# Patient Record
Sex: Female | Born: 1970 | Race: White | Hispanic: No | Marital: Married | Smoking: Never smoker
Health system: Southern US, Community
[De-identification: ages and names within clinical notes are randomized; demographics above are authoritative.]

## PROBLEM LIST (undated history)

## (undated) DIAGNOSIS — N809 Endometriosis, unspecified: Secondary | ICD-10-CM

## (undated) DIAGNOSIS — M797 Fibromyalgia: Secondary | ICD-10-CM

## (undated) DIAGNOSIS — Z8669 Personal history of other diseases of the nervous system and sense organs: Secondary | ICD-10-CM

## (undated) DIAGNOSIS — F329 Major depressive disorder, single episode, unspecified: Secondary | ICD-10-CM

## (undated) DIAGNOSIS — D219 Benign neoplasm of connective and other soft tissue, unspecified: Secondary | ICD-10-CM

## (undated) DIAGNOSIS — Z87440 Personal history of urinary (tract) infections: Secondary | ICD-10-CM

## (undated) DIAGNOSIS — U071 COVID-19: Secondary | ICD-10-CM

## (undated) DIAGNOSIS — E041 Nontoxic single thyroid nodule: Secondary | ICD-10-CM

## (undated) DIAGNOSIS — F32A Depression, unspecified: Secondary | ICD-10-CM

## (undated) DIAGNOSIS — F419 Anxiety disorder, unspecified: Secondary | ICD-10-CM

## (undated) DIAGNOSIS — Z8619 Personal history of other infectious and parasitic diseases: Secondary | ICD-10-CM

## (undated) HISTORY — DX: Benign neoplasm of connective and other soft tissue, unspecified: D21.9

## (undated) HISTORY — DX: Nontoxic single thyroid nodule: E04.1

## (undated) HISTORY — DX: Fibromyalgia: M79.7

## (undated) HISTORY — DX: Personal history of other diseases of the nervous system and sense organs: Z86.69

## (undated) HISTORY — DX: Anxiety disorder, unspecified: F41.9

## (undated) HISTORY — DX: Personal history of urinary (tract) infections: Z87.440

## (undated) HISTORY — DX: Personal history of other infectious and parasitic diseases: Z86.19

## (undated) HISTORY — DX: Depression, unspecified: F32.A

## (undated) HISTORY — DX: Endometriosis, unspecified: N80.9

## (undated) HISTORY — DX: Major depressive disorder, single episode, unspecified: F32.9

---
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
## 1997-09-01 ENCOUNTER — Other Ambulatory Visit: Admission: RE | Admit: 1997-09-01 | Discharge: 1997-09-01 | Payer: Self-pay | Admitting: Obstetrics and Gynecology

## 1997-10-06 ENCOUNTER — Encounter: Admission: RE | Admit: 1997-10-06 | Discharge: 1997-10-06 | Payer: Self-pay | Admitting: *Deleted

## 1998-09-24 ENCOUNTER — Other Ambulatory Visit: Admission: RE | Admit: 1998-09-24 | Discharge: 1998-09-24 | Payer: Self-pay | Admitting: Obstetrics and Gynecology

## 1998-12-17 ENCOUNTER — Ambulatory Visit (HOSPITAL_COMMUNITY): Admission: RE | Admit: 1998-12-17 | Discharge: 1998-12-17 | Payer: Self-pay | Admitting: Family Medicine

## 1998-12-17 ENCOUNTER — Encounter: Payer: Self-pay | Admitting: Family Medicine

## 1999-03-03 ENCOUNTER — Ambulatory Visit (HOSPITAL_COMMUNITY): Admission: RE | Admit: 1999-03-03 | Discharge: 1999-03-03 | Payer: Self-pay | Admitting: Obstetrics and Gynecology

## 1999-09-24 ENCOUNTER — Other Ambulatory Visit: Admission: RE | Admit: 1999-09-24 | Discharge: 1999-09-24 | Payer: Self-pay | Admitting: Obstetrics & Gynecology

## 2001-01-04 ENCOUNTER — Other Ambulatory Visit: Admission: RE | Admit: 2001-01-04 | Discharge: 2001-01-04 | Payer: Self-pay | Admitting: Obstetrics and Gynecology

## 2001-01-24 ENCOUNTER — Ambulatory Visit (HOSPITAL_COMMUNITY): Admission: RE | Admit: 2001-01-24 | Discharge: 2001-01-24 | Payer: Self-pay | Admitting: Obstetrics and Gynecology

## 2001-01-24 ENCOUNTER — Encounter: Payer: Self-pay | Admitting: Obstetrics and Gynecology

## 2001-08-16 ENCOUNTER — Encounter: Payer: Self-pay | Admitting: General Practice

## 2001-08-16 ENCOUNTER — Encounter: Admission: RE | Admit: 2001-08-16 | Discharge: 2001-08-16 | Payer: Self-pay | Admitting: General Practice

## 2002-02-18 ENCOUNTER — Other Ambulatory Visit: Admission: RE | Admit: 2002-02-18 | Discharge: 2002-02-18 | Payer: Self-pay | Admitting: Obstetrics and Gynecology

## 2002-06-17 ENCOUNTER — Ambulatory Visit (HOSPITAL_COMMUNITY): Admission: RE | Admit: 2002-06-17 | Discharge: 2002-06-17 | Payer: Self-pay | Admitting: *Deleted

## 2002-06-17 ENCOUNTER — Encounter: Payer: Self-pay | Admitting: *Deleted

## 2004-06-10 ENCOUNTER — Encounter: Admission: RE | Admit: 2004-06-10 | Discharge: 2004-06-10 | Payer: Self-pay | Admitting: Obstetrics and Gynecology

## 2005-04-11 HISTORY — PX: BREAST BIOPSY: SHX20

## 2007-05-18 ENCOUNTER — Encounter: Admission: RE | Admit: 2007-05-18 | Discharge: 2007-05-18 | Payer: Self-pay | Admitting: *Deleted

## 2009-04-11 HISTORY — PX: ABDOMINAL HYSTERECTOMY: SHX81

## 2009-04-11 LAB — HM PAP SMEAR: HM Pap smear: NORMAL

## 2009-06-18 ENCOUNTER — Encounter: Admission: RE | Admit: 2009-06-18 | Discharge: 2009-06-18 | Payer: Self-pay | Admitting: Certified Nurse Midwife

## 2010-03-30 ENCOUNTER — Encounter (INDEPENDENT_AMBULATORY_CARE_PROVIDER_SITE_OTHER): Payer: Self-pay | Admitting: Obstetrics & Gynecology

## 2010-03-30 ENCOUNTER — Ambulatory Visit (HOSPITAL_COMMUNITY)
Admission: RE | Admit: 2010-03-30 | Discharge: 2010-03-30 | Payer: Self-pay | Source: Home / Self Care | Attending: Obstetrics & Gynecology | Admitting: Obstetrics & Gynecology

## 2010-05-07 NOTE — Op Note (Addendum)
[name], [name]                  ACCOUNT NO.:  [id number]  MEDICAL RECORD NO.:  [id number]          PATIENT TYPE:  OIB  LOCATION:  9302                          FACILITY:  [facility]  PHYSICIAN:  Genia Del, M.D.DATE OF BIRTH:  05/12/1970  DATE OF PROCEDURE:  03/30/2010 DATE OF DISCHARGE:  03/30/2010                              OPERATIVE REPORT   PREOPERATIVE DIAGNOSIS:  Large uterine myomas with pelvic pain.  POSTOPERATIVE DIAGNOSIS:  Large uterine myomas with pelvic pain.  PROCEDURE:  Total laparoscopic hysterectomy assisted with da Vinci robot and morcellation of the uterus.  SURGEON:  Genia Del, MD  ASSISTANT:  Lendon Colonel, MD  ANESTHESIOLOGIST:  Jenelle Mages. Fortune, MD  PROCEDURE:  Under general anesthesia with endotracheal intubation, the patient is in lithotomy position, she was prepped with SurgiPrep on the abdomen and Betadine on the vulvar and vaginal areas.  The Foley was put in place in the bladder and the patient was draped as usual.  The vaginal exam reveals a large irregular uterus about 14-15 cm in diameter.  The uterus was mobile, no adnexal masses were felt.  We inserted the weighted speculum in the vagina.  The anterior lip of the cervix was grasped with a tenaculum.  We dilated the cervix with Hegar dilators up to #27 without difficulty.  Hysterometry was at 9 cm.  We used a #8 KOH ring with the medium-size RUMI, this was put in place without difficulty.  The other instruments were removed.  We went to the abdomen and measured 10 cm above the fundus of the uterus.  We infiltrated Marcaine 0.25 plain at that level where about 2 cm above the umbilicus.  We made a 1.5-cm incision with the scalpel.  We opened the aponeurosis with Mayo scissors under direct vision.  We opened the parietal peritoneum bluntly with a finger.  A pursestring stitch of Vicryl 0 was applied on the aponeurosis.  We then inserted the Hasson under direct vision.  We  created pneumoperitoneum with CO2.  We inspected the abdominopelvic cavities.  No pathology was present in the abdomen.  The anterior wall was free of adhesions.  The uterus was large and irregular with multiple myomas.  We then removed the camera.  We marked the skin for a semicircular configuration of the ports.  We infiltrated Marcaine 0.25 plain at each site and made small incisions with a scalpel.  We entered 2 robotic ports on the left side superiorly and inferiorly and 1 robotic port superiorly on the right and the assistant port on the lower right, all under direct vision.  We then docked the robot on the left side without difficulty and put the instruments and the EndoShear scissor on the first arm, the PK on the second arm, and the Cobra in the third arm.  We then went to the console.  We inspected the pelvic cavity further.  Both ovaries were normal in size and appearance.  Both tubes were normal in size and appearance.  The uterus has a large fundal myoma at least 9 cm in diameter.  A moderate-sized myoma was present anteriorly  measuring about 4-5 cm.  We started on the left side.  The ovaries will be preserved, so we cauterized and sectioned the left round ligament.  We cauterized and sectioned the left uteroovarian ligament and the left tube.  We then descended along the left side of the uterus.  We opened the visceral peritoneum on the left side and anteriorly towards the midline.  We then moved to the right side and did exactly the same.  We cauterized and sectioned the right round ligament.  We cauterized and sectioned the right uteroovarian ligament and the right tube and we then descended along the right side of the uterus.  We stopped just before the uterine artery.  We tried to open the visceral peritoneum anteriorly to descent the bladder, but finding the right plane and the right level is difficult.  We tried to adjust the KOH ring and position it more anteriorly.   We finally succeeded in having a good vision.  We completed the opening of the anterior visceral peritoneum and were able to descent the bladder past the KOH ring.  We cauterized and sectioned the left uterine artery.  We then cauterized and sectioned the right uterine artery that process also takes more time because of difficulties positioning the KOH ring ideally.  Finally, we have both uterine arteries well cauterized and the bladder well descended.  The uterus was blanching.  We were able to open the vaginal vault anteriorly along the KOH ring.  We opened with the tips of the EndoShear scissors and we went all the way to the left lateral side and towards the right lateral side. We then have to reposition the KOH ring to open the vaginal vault posteriorly.  We finally achieved a good position and were able to continue to open the vagina along the KOH ring.  We finished on the right lateral side.  The uterus was completely detached with the cervix and was put in the right gutter.  We verified hemostasis on all pedicles.  Hemostasis was adequate at all levels.  We had inflated the occluder vaginally and the KOH ring had been removed with the RUMI.  We then switched instruments.  We removed the EndoShear scissors and the PK as well as the Cobra.  We replaced those instruments by cutting needle driver in the first arm the regular needle driver in the second arm, and we put back the PK in the third arm.  We used Vicryl 0 six inches long and closed the vaginal vault with figure-of-eights.  We used one in the middle first and used it to pull the vaginal os for better vision.  We then put a figure-of-eight at the right angle, then the left angle, and finished closing the middle of the vaginal vault.  We took good bites each time including the vaginal mucosa.  The vaginal vault was well closed and hemostasis was adequate.  We confirmed that with irrigation and suction.  We then removed all  robotic instruments.  We undocked the robot and go by laparoscopy for morcellation.  We used the [employer] which replaced the assistance port.  We morcellated the uterus with the cervix and fibroids easily with that morcellator.  The weight of the specimen was 875 g, it was sent to Pathology.  We then irrigated and suctioned the abdominopelvic cavity.  We confirmed good hemostasis at all levels.  No remaining specimen was present in the abdominopelvic cavities.  We therefore removed all instruments.  We evacuated  the CO2.  We closed the supraumbilical incision by attaching the pursestring stitch on the aponeurosis.  We then used a Vicryl 0 at the supraumbilical incision to close the adipose tissue.  We did the same at the assistant port.  We then used Vicryl 4-0 on all incisions to close the skin in a subcuticular stitch and applied Dermabond on all incisions.  Hemostasis was adequate at all levels.  The occluder was removed from the vagina.  Hemostasis was adequate at that level as well. Note that the patient received Flagyl and gentamicin IV before induction.  The estimated blood loss was 250 mL.  The count of instruments and sponges was complete x2.  No complications occurred and the patient was brought to recovery room in good stable status.     Genia Del, M.D.     ML/MEDQ  D:  03/30/2010  T:  03/31/2010  Job:  [id number]  Electronically Signed by Genia Del M.D. on 05/07/2010 09:15:52 AM

## 2010-06-21 LAB — CBC
HCT: 43.3 % (ref 36.0–46.0)
Hemoglobin: 15 g/dL (ref 12.0–15.0)
MCH: 31.3 pg (ref 26.0–34.0)
MCHC: 34.6 g/dL (ref 30.0–36.0)
MCV: 90.4 fL (ref 78.0–100.0)
Platelets: 238 10*3/uL (ref 150–400)
RBC: 4.79 MIL/uL (ref 3.87–5.11)
RDW: 13.1 % (ref 11.5–15.5)
WBC: 7.7 10*3/uL (ref 4.0–10.5)

## 2010-06-21 LAB — SURGICAL PCR SCREEN
MRSA, PCR: NEGATIVE
Staphylococcus aureus: NEGATIVE

## 2010-06-21 LAB — PREGNANCY, URINE: Preg Test, Ur: NEGATIVE

## 2011-12-20 ENCOUNTER — Ambulatory Visit: Payer: Self-pay | Admitting: Internal Medicine

## 2011-12-21 ENCOUNTER — Other Ambulatory Visit: Payer: Self-pay | Admitting: Family Medicine

## 2011-12-21 DIAGNOSIS — Z1231 Encounter for screening mammogram for malignant neoplasm of breast: Secondary | ICD-10-CM

## 2012-01-06 ENCOUNTER — Ambulatory Visit
Admission: RE | Admit: 2012-01-06 | Discharge: 2012-01-06 | Disposition: A | Discharge status: Home / Self Care | Payer: 59 | Source: Ambulatory Visit | Attending: Family Medicine | Admitting: Family Medicine

## 2012-01-06 DIAGNOSIS — Z1231 Encounter for screening mammogram for malignant neoplasm of breast: Secondary | ICD-10-CM

## 2012-06-09 LAB — HM DIABETES EYE EXAM

## 2013-01-16 ENCOUNTER — Other Ambulatory Visit: Payer: Self-pay

## 2013-01-16 DIAGNOSIS — Z1231 Encounter for screening mammogram for malignant neoplasm of breast: Secondary | ICD-10-CM

## 2013-02-15 ENCOUNTER — Ambulatory Visit: Payer: 59

## 2013-03-22 ENCOUNTER — Ambulatory Visit: Payer: 59

## 2013-04-25 ENCOUNTER — Ambulatory Visit
Admission: RE | Admit: 2013-04-25 | Discharge: 2013-04-25 | Disposition: A | Discharge status: Home / Self Care | Payer: 59 | Source: Ambulatory Visit

## 2013-04-25 DIAGNOSIS — Z1231 Encounter for screening mammogram for malignant neoplasm of breast: Secondary | ICD-10-CM

## 2013-06-09 LAB — HM MAMMOGRAPHY: HM Mammogram: NORMAL

## 2013-09-17 ENCOUNTER — Ambulatory Visit (INDEPENDENT_AMBULATORY_CARE_PROVIDER_SITE_OTHER): Payer: 59 | Admitting: Internal Medicine

## 2013-09-17 ENCOUNTER — Other Ambulatory Visit (INDEPENDENT_AMBULATORY_CARE_PROVIDER_SITE_OTHER): Payer: 59

## 2013-09-17 ENCOUNTER — Encounter: Payer: Self-pay | Admitting: Internal Medicine

## 2013-09-17 VITALS — BP 120/84 | HR 80 | Temp 98.2°F | Ht 63.0 in | Wt 195.4 lb

## 2013-09-17 DIAGNOSIS — IMO0001 Reserved for inherently not codable concepts without codable children: Secondary | ICD-10-CM

## 2013-09-17 DIAGNOSIS — R634 Abnormal weight loss: Secondary | ICD-10-CM

## 2013-09-17 DIAGNOSIS — F341 Dysthymic disorder: Secondary | ICD-10-CM

## 2013-09-17 DIAGNOSIS — F418 Other specified anxiety disorders: Secondary | ICD-10-CM | POA: Insufficient documentation

## 2013-09-17 DIAGNOSIS — Z1289 Encounter for screening for malignant neoplasm of other sites: Secondary | ICD-10-CM

## 2013-09-17 DIAGNOSIS — L659 Nonscarring hair loss, unspecified: Secondary | ICD-10-CM

## 2013-09-17 DIAGNOSIS — M797 Fibromyalgia: Secondary | ICD-10-CM | POA: Insufficient documentation

## 2013-09-17 DIAGNOSIS — E049 Nontoxic goiter, unspecified: Secondary | ICD-10-CM

## 2013-09-17 LAB — BASIC METABOLIC PANEL
BUN: 10 mg/dL (ref 6–23)
CO2: 27 mEq/L (ref 19–32)
Calcium: 9.3 mg/dL (ref 8.4–10.5)
Chloride: 105 mEq/L (ref 96–112)
Creatinine, Ser: 0.8 mg/dL (ref 0.4–1.2)
GFR: 78.3 mL/min (ref >60.00)
Glucose, Bld: 96 mg/dL (ref 70–99)
Potassium: 4.3 mEq/L (ref 3.5–5.1)
Sodium: 139 mEq/L (ref 135–145)

## 2013-09-17 LAB — CBC WITH DIFFERENTIAL/PLATELET
Basophils Absolute: 0 10*3/uL (ref 0.0–0.1)
Basophils Relative: 0.3 % (ref 0.0–3.0)
Eosinophils Absolute: 0.2 10*3/uL (ref 0.0–0.7)
Eosinophils Relative: 2.6 % (ref 0.0–5.0)
HCT: 41.8 % (ref 36.0–46.0)
Hemoglobin: 14.3 g/dL (ref 12.0–15.0)
Lymphocytes Relative: 27.6 % (ref 12.0–46.0)
Lymphs Abs: 2.1 10*3/uL (ref 0.7–4.0)
MCHC: 34.1 g/dL (ref 30.0–36.0)
MCV: 93.1 fl (ref 78.0–100.0)
Monocytes Absolute: 0.7 10*3/uL (ref 0.1–1.0)
Monocytes Relative: 8.9 % (ref 3.0–12.0)
Neutro Abs: 4.6 10*3/uL (ref 1.4–7.7)
Neutrophils Relative %: 60.6 % (ref 43.0–77.0)
Platelets: 226 10*3/uL (ref 150.0–400.0)
RBC: 4.49 Mil/uL (ref 3.87–5.11)
RDW: 13.3 % (ref 11.5–15.5)
WBC: 7.5 10*3/uL (ref 4.0–10.5)

## 2013-09-17 LAB — T4, FREE: Free T4: 0.82 ng/dL (ref 0.60–1.60)

## 2013-09-17 LAB — FERRITIN: Ferritin: 61.5 ng/mL (ref 10.0–291.0)

## 2013-09-17 LAB — VITAMIN D 25 HYDROXY (VIT D DEFICIENCY, FRACTURES): VITD: 23.35 ng/mL

## 2013-09-17 LAB — TSH: TSH: 0.71 u[IU]/mL (ref 0.35–4.50)

## 2013-09-17 NOTE — Patient Instructions (Signed)
It was good to see you today.  We have reviewed your prior records including labs and tests today  we will send to your prior provider(s) for "release of records" as discussed today.  Test(s) ordered today. Your results will be released to [place] (or called to you) after review, usually within 72hours after test completion. If any changes need to be made, you will be notified at that same time.  Medications reviewed and updated, no changes recommended at this time.  we'll make referral to gynecologist, dermatologist and for ultrasound of goiter. Our office will contact you regarding appointment(s) once made.  Please schedule followup in 3-4 months for review/recheck, call sooner if problems.

## 2013-09-17 NOTE — Progress Notes (Signed)
Subjective:    Patient ID: [name], female    DOB: 10-Mar-1971, 43 y.o.   MRN: [id number]  HPI  New patient to me and our practice, here to establish with PCP  Reviewed chronic medical issues and interval medical events: FM, depression, goiter  Also concerned about hair loss - reports thinning affects scalp only, not other body hair, and specifically top of head.  Loss ongoing >10 years but worse in past 6 mo -  denies change in chronic pattern of hair treatment (dye/color).  Concerned thinning may be related to medications, specifically wellbutrin. Reports psyc MD recently checked labs with mild abn thyroid but no copy of labs available today  Past Medical History  Diagnosis Date  . History of chicken pox   . Depression   . History of migraine   . History of UTI    Family History  Problem Relation Age of Onset  . Arthritis Mother   . Hyperlipidemia Mother   . Hypertension Mother   . Stroke Mother   . Mental illness Mother   . Diabetes Mother   . Arthritis Father   . Hyperlipidemia Father   . Hypertension Father   . Heart disease Maternal Grandmother   . Stroke Maternal Grandmother   . Prostate cancer Paternal Grandfather   . Breast cancer Cousin    History  Substance Use Topics  . Smoking status: Never Smoker   . Smokeless tobacco: Not on file  . Alcohol Use: No    Review of Systems  Constitutional: Negative for fatigue and unexpected weight change.  Respiratory: Negative for cough, shortness of breath and wheezing.   Cardiovascular: Negative for chest pain, palpitations and leg swelling.  Gastrointestinal: Negative for nausea, abdominal pain and diarrhea.  Neurological: Negative for dizziness, weakness, light-headedness and headaches.  Psychiatric/Behavioral: Negative for dysphoric mood. The patient is not nervous/anxious.   All other systems reviewed and are negative.      Objective:   Physical Exam  BP 120/84  Pulse 80  Temp(Src) 98.2 F (36.8 C)  (Oral)  Ht 5\' 3"  (1.6 m)  Wt 195 lb 6.4 oz (88.633 kg)  BMI 34.62 kg/m2  SpO2 95% Wt Readings from Last 3 Encounters:  09/17/13 195 lb 6.4 oz (88.633 kg)   Constitutional: She is obese, but appears well-developed and well-nourished. No distress.  HENT: Head: Normocephalic and atraumatic. Ears: B TMs ok, no erythema or effusion; Nose: Nose normal. Mouth/Throat: Oropharynx is clear and moist. No oropharyngeal exudate.  Eyes: Conjunctivae and EOM are normal. Pupils are equal, round, and reactive to light. No scleral icterus.  Neck: anterior fullness consistent with goiter. Normal range of motion. Neck supple. No JVD present.  Cardiovascular: Normal rate, regular rhythm and normal heart sounds.  No murmur heard. No BLE edema. Pulmonary/Chest: Effort normal and breath sounds normal. No respiratory distress. She has no wheezes.  Abdominal: Soft. Bowel sounds are normal. She exhibits no distension. There is no tenderness. no masses Musculoskeletal: Normal range of motion, no joint effusions. No gross deformities Neurological: She is alert and oriented to person, place, and time. No cranial nerve deficit. Coordination, balance, strength, speech and gait are normal.  Skin: Marked thinning of hair distribution at crown, but no alopecia or ulceration/inflammtion - no balding or broken hairs. No eyebrow thinning. Skin is warm and dry. No rash noted. No erythema.  Psychiatric: She has a normal mood and affect. Her behavior is normal. Judgment and thought content normal.     Lab  Results  Component Value Date   WBC 7.7 03/26/2010   HGB 15.0 03/26/2010   HCT 43.3 03/26/2010   PLT 238 03/26/2010    Mm Screening Breast Tomo Bilateral  04/25/2013   CLINICAL DATA:  Screening.  EXAM: DIGITAL SCREENING BILATERAL MAMMOGRAM WITH 3D TOMO WITH CAD  COMPARISON:  Previous exam(s).  ACR Breast Density Category c: The breast tissue is heterogeneously dense, which may obscure small masses.  FINDINGS: There are no  findings suspicious for malignancy. Images were processed with CAD.  IMPRESSION: No mammographic evidence of malignancy. A result letter of this screening mammogram will be mailed directly to the patient.  RECOMMENDATION: Screening mammogram in one year. (Code:SM-B-01Y)  BI-RADS CATEGORY  1: Negative   Electronically Signed   By: Abelardo Diesel M.D.   On: 04/25/2013 14:09       Assessment & Plan:   Problem List Items Addressed This Visit   Depression with anxiety     Chronic symptoms, currently controlled with med regimen Defer mgmt to psyc as ongoing Support provided at length today    Relevant Orders      Basic metabolic panel (Completed)      CBC with Differential (Completed)      Ferritin (Completed)      Vit D  25 hydroxy (rtn osteoporosis monitoring) (Completed)   Fibromyalgia     chronic symptoms - much improved with wellbutrin Intol of prior Lyrica trial May need to reconsider specific tx if changes in Wellbutrin occur as result of concern for ?hair loss side effects     Relevant Orders      Basic metabolic panel (Completed)      CBC with Differential (Completed)      Ferritin (Completed)      Vit D  25 hydroxy (rtn osteoporosis monitoring) (Completed)   Goiter     Reports prior "iodine" treatment for same >5 years ago Recheck [place] with labs now Consider refer to endo if problem/if needed    Relevant Orders      T4, free (Completed)      T3 (Completed)      TSH (Completed)      Basic metabolic panel (Completed)      CBC with Differential (Completed)      Ferritin (Completed)      Vit D  25 hydroxy (rtn osteoporosis monitoring) (Completed)      US Soft Tissue Head/Neck   Hair loss - Primary     Ongoing for years - worse 6 mo by hx No evidence for alopecia on exam but will refer to derm for this and other skin check review per pt request Recheck TFTs with other labs to exclude metabolic issue contributing to same Advised avoidance of color chemical tx on hair To  follow up with pscy re: potential effect of Wellbutrin on same, no changes recommended by me today    Relevant Orders      T4, free (Completed)      TSH (Completed)      Basic metabolic panel (Completed)      CBC with Differential (Completed)      Ferritin (Completed)      Vit D  25 hydroxy (rtn osteoporosis monitoring) (Completed)      Ambulatory referral to Dermatology    Other Visit Diagnoses   Loss of weight        Relevant Orders       Basic metabolic panel (Completed)       CBC with  Differential (Completed)       Ferritin (Completed)       Vit D  25 hydroxy (rtn osteoporosis monitoring) (Completed)    Encounter for pelvic screening for malignant neoplasm        Relevant Orders       Ambulatory referral to Obstetrics / Gynecology

## 2013-09-18 DIAGNOSIS — E049 Nontoxic goiter, unspecified: Secondary | ICD-10-CM | POA: Insufficient documentation

## 2013-09-18 LAB — T3: T3, Total: 140.2 ng/dL (ref 80.0–204.0)

## 2013-09-18 NOTE — Assessment & Plan Note (Signed)
Chronic symptoms, currently controlled with med regimen Defer mgmt to psyc as ongoing Support provided at length today

## 2013-09-18 NOTE — Assessment & Plan Note (Signed)
Ongoing for years - worse 6 mo by hx No evidence for alopecia on exam but will refer to derm for this and other skin check review per pt request Recheck TFTs with other labs to exclude metabolic issue contributing to same Advised avoidance of color chemical tx on hair To follow up with pscy re: potential effect of Wellbutrin on same, no changes recommended by me today

## 2013-09-18 NOTE — Assessment & Plan Note (Signed)
chronic symptoms - much improved with wellbutrin Intol of prior Lyrica trial May need to reconsider specific tx if changes in Wellbutrin occur as result of concern for ?hair loss side effects

## 2013-09-18 NOTE — Assessment & Plan Note (Signed)
Reports prior "iodine" treatment for same >5 years ago Recheck [place] with labs now Consider refer to endo if problem/if needed

## 2013-09-26 ENCOUNTER — Telehealth: Payer: Self-pay | Admitting: Obstetrics and Gynecology

## 2013-09-26 NOTE — Telephone Encounter (Signed)
LMTCB to schedule a new patient doctor referral.

## 2013-09-30 NOTE — Telephone Encounter (Signed)
Scheduled

## 2013-10-09 ENCOUNTER — Ambulatory Visit (INDEPENDENT_AMBULATORY_CARE_PROVIDER_SITE_OTHER): Payer: 59 | Admitting: Internal Medicine

## 2013-10-09 ENCOUNTER — Encounter: Payer: Self-pay | Admitting: Internal Medicine

## 2013-10-09 VITALS — BP 140/90 | HR 81 | Temp 98.4°F | Wt 192.0 lb

## 2013-10-09 DIAGNOSIS — J0100 Acute maxillary sinusitis, unspecified: Secondary | ICD-10-CM

## 2013-10-09 MED ORDER — DOXYCYCLINE HYCLATE 100 MG PO TABS: 100.0000 mg | ORAL_TABLET | Freq: Two times a day (BID) | ORAL | Status: DC

## 2013-10-09 NOTE — Patient Instructions (Signed)

## 2013-10-09 NOTE — Progress Notes (Signed)
   Subjective:    Patient ID: [name], female    DOB: 1971-01-03, 43 y.o.   MRN: [id number]  HPI   Symptoms began as head congestion 10/06/13. As of 6/30 she began to have chest congestion and heaviness.  She describes significant facial pain and frontal sinus area pain, dental pain and purulent nasal discharge.  She's had some yellow sputum but there is much greater volume from the head. This is mainly productive after taking a shower.  She is taking Sudafed with some relief. She is a nonsmoker. She does not take a flu shot.    Review of Systems  She denies otic pain or otic discharge. She is not having wheezing or shortness of breath.  She also denies fever, chills, or sweats.  She has only minor extrinsic symptoms of itchy, watery eyes.       Objective:   Physical Exam General appearance:good health ;well nourished; no acute distress or increased work of breathing is present.  No  lymphadenopathy about the head, neck, or axilla noted.   Eyes: No conjunctival inflammation or lid edema is present. There is no scleral icterus.  Ears:  External ear exam shows no significant lesions or deformities.  Otoscopic examination reveals clear canals, tympanic membranes are intact bilaterally without bulging, retraction, inflammation or discharge.  Nose:  External nasal examination shows no deformity or inflammation. Nasal mucosa are pink and moist without lesions or exudates. No septal dislocation or deviation.No obstruction to airflow.   Oral exam: Dental hygiene is good; lips and gums are healthy appearing.There is no oropharyngeal erythema or exudate noted.   Neck:  No deformities,  masses, or tenderness noted.  Asymmetric goiter Supple with full range of motion without pain.   Heart:  Normal rate and regular rhythm. S1 and S2 normal without gallop, murmur, click, rub or other extra sounds.   Lungs:Chest clear to auscultation; no wheezes, rhonchi,rales ,or rubs present.No  increased work of breathing.    Extremities:  No cyanosis, edema, or clubbing  noted    Skin: Warm & dry w/o jaundice or tenting.   There is some mild thinning of the scalp.        Assessment & Plan:  #1 rhinosinusitis without significant bronchitis  Plan: Nasal hygiene interventions discussed. See prescription medications

## 2013-10-09 NOTE — Progress Notes (Signed)
Pre visit review using our clinic review tool, if applicable. No additional management support is needed unless otherwise documented below in the visit note. 

## 2013-10-14 ENCOUNTER — Telehealth: Payer: Self-pay | Admitting: Internal Medicine

## 2013-10-14 MED ORDER — LEVOFLOXACIN 500 MG PO TABS: 500.0000 mg | ORAL_TABLET | Freq: Every day | ORAL | Status: DC

## 2013-10-14 NOTE — Telephone Encounter (Signed)
Pt was given doxycycline.  She started it on Friday.  It is making her sick, nausea, stomach pain, shakey.  She couldn't eat and thinks her bs dropped because of that.  She wants a different antibiotic called in.  She is allergic to several antibiotics.  Pharmacy is [facility] at [facility].

## 2013-10-14 NOTE — Telephone Encounter (Signed)
Pt is aware.  

## 2013-10-14 NOTE — Telephone Encounter (Signed)
Stop doxycycline Take Levaquin 500 mg daily for 7 days -erx done

## 2013-10-16 ENCOUNTER — Ambulatory Visit: Payer: Self-pay | Admitting: Obstetrics and Gynecology

## 2013-10-16 ENCOUNTER — Other Ambulatory Visit: Payer: 59

## 2013-11-01 ENCOUNTER — Other Ambulatory Visit: Payer: 59

## 2013-12-04 ENCOUNTER — Encounter: Payer: Self-pay | Admitting: Obstetrics and Gynecology

## 2013-12-04 ENCOUNTER — Ambulatory Visit (INDEPENDENT_AMBULATORY_CARE_PROVIDER_SITE_OTHER): Payer: 59 | Admitting: Obstetrics and Gynecology

## 2013-12-04 VITALS — BP 140/70 | HR 70 | Resp 16 | Ht 63.5 in | Wt 197.8 lb

## 2013-12-04 DIAGNOSIS — Z01419 Encounter for gynecological examination (general) (routine) without abnormal findings: Secondary | ICD-10-CM

## 2013-12-04 NOTE — Progress Notes (Signed)
Patient ID: [name], female   DOB: 02/17/1971, 43 y.o.   MRN: [id number] GYNECOLOGY VISIT  PCP:  Gwendolyn Grant, MD  Referring provider:   HPI: 43 y.o.   Married  Caucasian  female   G1P0010 with No LMP recorded. Patient has had a hysterectomy.   here for  AEX.   She is a new patient here.   Patient asking about the necessity of her visit to our office.  "Dr. Asa Lente told me I needed to come." "I've had a hysterectomy and my mammogram was normal this year."  Patient wanted a breast MRI, but insurance will not pay for it. Has a history of atypia on breast biopsy.   Laparoscopic robotic  hysterectomy in 2011 - fibroids.  Dr. Dellis Filbert. Ovaries and tubes remain.   No hot flashes.   Has bilateral breast tenderness all the time.  First cousin had breast cancer in her late 78s.   Has fibromyalgia.  Wants treatment for this.  Has stopped Wellbutrin due to hair loss.   Hgb:    PCP Urine:  PCP  GYNECOLOGIC HISTORY: No LMP recorded. Patient has had a hysterectomy. Sexually active:  yes Partner preference: female Contraception:   Hysterectomy Menopausal hormone therapy: none DES exposure:  no Blood transfusions:   no Sexually transmitted diseases:   no GYN procedures and prior surgeries: Left lumpectomy age 71--benign.  Fine needle biopsy showed atypical cells.  Final pathology was negative. Has asked for an MRI but it was denied.  Last mammogram:  04-25-13 heterogeneously dense breasts, otherwise normal:[facility]               Last pap and high risk HPV testing:  2011 wnl  History of abnormal pap smear:  no   OB History   Grav Para Term Preterm Abortions TAB SAB Ect Mult Living   1    1  1           LIFESTYLE: Exercise:  No.  Not interested.            OTHER HEALTH MAINTENANCE: Tetanus/TDap:   Greater than 10 years(seeing PCP again in 12/2013) HPV:               n/a Influenza:        never   Bone density:   n/a Colonoscopy:  n/a  Cholesterol check: 09/2013  normal  Family History  Problem Relation Age of Onset  . Arthritis Mother   . Hyperlipidemia Mother   . Hypertension Mother   . Stroke Mother   . Mental illness Mother   . Arthritis Father   . Hyperlipidemia Father   . Hypertension Father   . Diabetes Father   . Heart disease Maternal Grandmother   . Stroke Maternal Grandmother   . Prostate cancer Paternal Grandfather   . Breast cancer Cousin   . Hypertension Brother     Patient Active Problem List   Diagnosis Date Noted  . Goiter 09/18/2013  . Hair loss   . Depression with anxiety   . Fibromyalgia    Past Medical History  Diagnosis Date  . History of chicken pox   . Depression   . History of migraine   . History of UTI   . Fibromyalgia   . Anxiety   . Endometriosis   . Fibroid   . 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 Asa Lente    Past Surgical History  Procedure Laterality Date  . Tonsillectomy  1988  . Abdominal hysterectomy  2011  . Breast biopsy  2007    dense breast    ALLERGIES: Erythromycin; Keflex; Penicillins; and Sulfa antibiotics  Current Outpatient Prescriptions  Medication Sig Dispense Refill  . cetirizine (ZYRTEC) 10 MG tablet Take 10 mg by mouth daily.       No current facility-administered medications for this visit.  Does a vitamin supplement drink every day.   ROS:  Pertinent items are noted in HPI.  History   Social History  . Marital Status: Married    Spouse Name: N/A    Number of Children: N/A  . Years of Education: N/A   Occupational History  . Not on file.   Social History Main Topics  . Smoking status: Never Smoker   . Smokeless tobacco: Not on file  . Alcohol Use: No  . Drug Use: No  . Sexual Activity: Yes    Partners: Male     Comment: Hyst   Other Topics Concern  . Not on file   Social History Narrative  . No narrative on file    PHYSICAL EXAMINATION:    BP 140/70  Pulse 70  Resp 16  Ht 5' 3.5" (1.613 m)  Wt 197 lb 12.8 oz  (89.721 kg)  BMI 34.48 kg/m2   Wt Readings from Last 3 Encounters:  12/04/13 197 lb 12.8 oz (89.721 kg)  10/09/13 192 lb (87.091 kg)  09/17/13 195 lb 6.4 oz (88.633 kg)     Ht Readings from Last 3 Encounters:  12/04/13 5' 3.5" (1.613 m)  09/17/13 5\' 3"  (1.6 m)    General appearance: alert, cooperative and appears stated age Head: Normocephalic, without obvious abnormality, atraumatic Neck: no adenopathy, supple, symmetrical, trachea midline and thyroid not enlarged, symmetric, no tenderness/mass/nodules Lungs: clear to auscultation bilaterally Breasts: Inspection negative on right, left breast with scar, No nipple retraction or dimpling, No nipple discharge or bleeding, No axillary or supraclavicular adenopathy, Normal to palpation without dominant masses Heart: regular rate and rhythm Abdomen: soft, non-tender; no masses,  no organomegaly Extremities: extremities normal, atraumatic, no cyanosis or edema Skin: Skin color, texture, turgor normal. No rashes or lesions Lymph nodes: Cervical, supraclavicular, and axillary nodes normal. No abnormal inguinal nodes palpated Neurologic: Grossly normal  Pelvic: External genitalia:  no lesions              Urethra:  normal appearing urethra with no masses, tenderness or lesions              Bartholins and Skenes: normal                 Vagina: normal appearing vagina with normal color and discharge, no lesions              Cervix:  absent              Pap and high risk HPV testing done: No.        Bimanual Exam:  Uterus:   absent                                      Adnexa: normal adnexa in size, nontender and no masses                                      Rectovaginal:  Yes.  Confirms above.                                      Anus:  normal sphincter tone, no lesions  ASSESSMENT  Normal gynecologic exam. Status post robotic laparoscopic hysterectomy.  History of breast atypia many years ago on  breast biopsy.   PLAN  I educated patient to the purpose of well woman visits.  Mammogram recommended yearly starting at age 57. Pap smear and high risk HPV testing as above. Counseled on self breast exam, Calcium and vitamin D intake, exercise. See lab orders: No. Return annually or prn   An After Visit Summary was printed and given to the patient.  Chaperone present:  Emelia Salisbury.

## 2013-12-04 NOTE — Patient Instructions (Signed)

## 2013-12-09 ENCOUNTER — Other Ambulatory Visit: Payer: 59

## 2013-12-11 ENCOUNTER — Other Ambulatory Visit: Payer: 59

## 2013-12-20 ENCOUNTER — Ambulatory Visit: Payer: 59 | Admitting: Internal Medicine

## 2013-12-23 ENCOUNTER — Encounter: Payer: Self-pay | Admitting: Internal Medicine

## 2013-12-23 ENCOUNTER — Ambulatory Visit (INDEPENDENT_AMBULATORY_CARE_PROVIDER_SITE_OTHER): Payer: 59 | Admitting: Internal Medicine

## 2013-12-23 VITALS — BP 126/86 | HR 74 | Temp 98.4°F | Ht 63.0 in | Wt 200.0 lb

## 2013-12-23 DIAGNOSIS — L659 Nonscarring hair loss, unspecified: Secondary | ICD-10-CM

## 2013-12-23 DIAGNOSIS — M797 Fibromyalgia: Secondary | ICD-10-CM

## 2013-12-23 DIAGNOSIS — F341 Dysthymic disorder: Secondary | ICD-10-CM

## 2013-12-23 DIAGNOSIS — IMO0001 Reserved for inherently not codable concepts without codable children: Secondary | ICD-10-CM

## 2013-12-23 DIAGNOSIS — E049 Nontoxic goiter, unspecified: Secondary | ICD-10-CM

## 2013-12-23 DIAGNOSIS — Z23 Encounter for immunization: Secondary | ICD-10-CM

## 2013-12-23 DIAGNOSIS — F418 Other specified anxiety disorders: Secondary | ICD-10-CM

## 2013-12-23 MED ORDER — NORTRIPTYLINE HCL 10 MG PO CAPS: 10.0000 mg | ORAL_CAPSULE | Freq: Every day | ORAL | Status: DC

## 2013-12-23 NOTE — Progress Notes (Signed)
Pre visit review using our clinic review tool, if applicable. No additional management support is needed unless otherwise documented below in the visit note. 

## 2013-12-23 NOTE — Progress Notes (Signed)
Subjective:    Patient ID: [name], female    DOB: Aug 23, 1970, 43 y.o.   MRN: [id number]  HPI  Patient is here for follow up  Reviewed chronic medical issues and interval medical events  Past Medical History  Diagnosis Date  . History of chicken pox   . Depression   . History of migraine   . History of UTI   . Fibromyalgia   . Anxiety   . Endometriosis   . Fibroid   . 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 Asa Lente    Review of Systems  Constitutional: Positive for fatigue. Negative for fever and unexpected weight change.  HENT: Negative for sore throat and trouble swallowing.   Respiratory: Negative for cough and shortness of breath.   Cardiovascular: Negative for chest pain, palpitations and leg swelling.  Psychiatric/Behavioral: Positive for sleep disturbance. Negative for self-injury, dysphoric mood and decreased concentration. The patient is nervous/anxious ("mild").        Objective:   Physical Exam  BP 126/86  Pulse 74  Temp(Src) 98.4 F (36.9 C) (Oral)  Ht 5\' 3"  (1.6 m)  Wt 200 lb (90.719 kg)  BMI 35.44 kg/m2  SpO2 95% Wt Readings from Last 3 Encounters:  12/23/13 200 lb (90.719 kg)  12/04/13 197 lb 12.8 oz (89.721 kg)  10/09/13 192 lb (87.091 kg)   Constitutional: She is overweight, appears well-developed and well-nourished. No distress.  Neck: Normal range of motion. Neck supple. No JVD present. Mild R>L goiter present?  Cardiovascular: Normal rate, regular rhythm and normal heart sounds.  No murmur heard. No BLE edema. Pulmonary/Chest: Effort normal and breath sounds normal. No respiratory distress. She has no wheezes.  Skin: thinning preauricular patter without alpoecia Psychiatric: She has a normal mood and affect. Her behavior is normal. Judgment and thought content normal.   Lab Results  Component Value Date   WBC 7.5 09/17/2013   HGB 14.3 09/17/2013   HCT 41.8 09/17/2013   PLT 226.0 09/17/2013   GLUCOSE 96 09/17/2013   NA 139  09/17/2013   K 4.3 09/17/2013   CL 105 09/17/2013   CREATININE 0.8 09/17/2013   BUN 10 09/17/2013   CO2 27 09/17/2013   TSH 0.71 09/17/2013    Mm Screening Breast Tomo Bilateral  04/25/2013   CLINICAL DATA:  Screening.  EXAM: DIGITAL SCREENING BILATERAL MAMMOGRAM WITH 3D TOMO WITH CAD  COMPARISON:  Previous exam(s).  ACR Breast Density Category c: The breast tissue is heterogeneously dense, which may obscure small masses.  FINDINGS: There are no findings suspicious for malignancy. Images were processed with CAD.  IMPRESSION: No mammographic evidence of malignancy. A result letter of this screening mammogram will be mailed directly to the patient.  RECOMMENDATION: Screening mammogram in one year. (Code:SM-B-01Y)  BI-RADS CATEGORY  1: Negative   Electronically Signed   By: Abelardo Diesel M.D.   On: 04/25/2013 14:09       Assessment & Plan:   Problem List Items Addressed This Visit   Depression with anxiety     Chronic symptoms, currently controlled at this time except insomnia, off Wellbutrin since 10/2013 - remote Lexapro caused side effects  Ongoing mgmt per psyc though pt reluctant to continue following there Support provided at length today    Fibromyalgia - Primary     chronic symptoms - much improved with wellbutrin but self DC'd 10/2013 due to concern for ?hair loss side effects  Never began recommended prior Lyrica trial due  to feared side effects  Feels sleep is most problematic at this time - As pt reluctant to begin SNRI due to prior problems with Lexapro, will start low dose TCA - nortrip we reviewed potential risk/benefit and possible side effects - pt understands and agrees to same     Goiter     Reports prior "iodine" treatment for same >5 years ago Recheck [place] pending - (scheduleing issues this summer) refer to endo at recommended of derm (per pt)    Relevant Orders      Ambulatory referral to Endocrinology   Hair loss     Ongoing for years - worse since start 2015 per pt Derm eval  Tonia Brooms) 12/2013 - recommended endo due to goiter hx and labs (will need to review consult note), but rec likely genetic and hormonal No evidence for alopecia on exam  Advised avoidance of color chemical tx on hair Pt stopped Wellbutrin 10/2012 because potential effect on same    Relevant Orders      Ambulatory referral to Endocrinology    Other Visit Diagnoses   Need for prophylactic vaccination and inoculation against influenza        Relevant Orders       Flu Vaccine QUAD 36+ mos PF IM (Fluarix Quad PF) (Completed)

## 2013-12-23 NOTE — Patient Instructions (Addendum)
It was good to see you today.  We have reviewed your prior records including labs and tests today  Will order test(s) after review of Dr Marisue Ivan recommendations. Your results will be released to [place] (or called to you) after review, usually within 72hours after test completion. If any changes need to be made, you will be notified at that same time.  Medications reviewed and updated,start nortriptyline at bedtime for fibromyalgia and sleep -. Your prescription(s) have been submitted to your pharmacy. Please take as directed and contact our office if you believe you are having problem(s) with the medication(s).  we'll make referral to endocrine for your goiter evaluation and hair loss. Our office will contact you regarding appointment(s) once made.  Your annual flu shot was given and/or updated today.  Please schedule followup in 6-12 months, call sooner if problems.

## 2013-12-23 NOTE — Assessment & Plan Note (Signed)
Ongoing for years - worse since start 2015 per pt Derm eval Tonia Brooms) 12/2013 - recommended endo due to goiter hx and labs (will need to review consult note), but rec likely genetic and hormonal No evidence for alopecia on exam  Advised avoidance of color chemical tx on hair Pt stopped Wellbutrin 10/2012 because potential effect on same

## 2013-12-23 NOTE — Assessment & Plan Note (Signed)
chronic symptoms - much improved with wellbutrin but self DC'd 10/2013 due to concern for ?hair loss side effects  Never began recommended prior Lyrica trial due to feared side effects  Feels sleep is most problematic at this time - As pt reluctant to begin SNRI due to prior problems with Lexapro, will start low dose TCA - nortrip we reviewed potential risk/benefit and possible side effects - pt understands and agrees to same

## 2013-12-23 NOTE — Assessment & Plan Note (Signed)
Chronic symptoms, currently controlled at this time except insomnia, off Wellbutrin since 10/2013 - remote Lexapro caused side effects  Ongoing mgmt per psyc though pt reluctant to continue following there Support provided at length today

## 2013-12-23 NOTE — Assessment & Plan Note (Signed)
Reports prior "iodine" treatment for same >5 years ago Recheck [place] pending - (scheduleing issues this summer) refer to endo at recommended of derm (per pt)

## 2013-12-31 ENCOUNTER — Telehealth: Payer: Self-pay

## 2013-12-31 DIAGNOSIS — L659 Nonscarring hair loss, unspecified: Secondary | ICD-10-CM

## 2013-12-31 NOTE — Telephone Encounter (Signed)
Pt called in to see if we had the report/results from Dr. Tonia Brooms (Dermatology).   Pt was asking if labs have been entered for her. I told pt that there were no labs in the system right now.

## 2013-12-31 NOTE — Telephone Encounter (Signed)
i have not seen anything - Ok to call derm office and ask OV notes to be faxed for our review to address requested labs thanks

## 2014-01-01 NOTE — Telephone Encounter (Signed)
Dermatology office sent over the [facility] notes:   Following test were requested if not already completed:  Thyroid, cbc, ferritin, chem profile, DHEAS and free testosterone.    On 09/17/2013: Cbc, thyroid, ferritn and BMET were completed.

## 2014-01-02 NOTE — Telephone Encounter (Signed)
Additional labs not initially done ordered - please let pt know to come in for same Then will fax all 09/2013 and new labs to derm as requested - thanks

## 2014-01-02 NOTE — Telephone Encounter (Signed)
Pt informed that labs are entered and ready for her at pt convenience. Pt stated that she will go on Friday 01/03/14

## 2014-01-08 ENCOUNTER — Ambulatory Visit
Admission: RE | Admit: 2014-01-08 | Discharge: 2014-01-08 | Disposition: A | Discharge status: Home / Self Care | Payer: 59 | Source: Ambulatory Visit | Attending: Internal Medicine | Admitting: Internal Medicine

## 2014-01-08 DIAGNOSIS — E049 Nontoxic goiter, unspecified: Secondary | ICD-10-CM

## 2014-01-09 ENCOUNTER — Encounter: Payer: Self-pay | Admitting: Family Medicine

## 2014-01-09 ENCOUNTER — Ambulatory Visit (INDEPENDENT_AMBULATORY_CARE_PROVIDER_SITE_OTHER): Payer: 59 | Admitting: Family Medicine

## 2014-01-09 VITALS — BP 102/64 | HR 82 | Temp 98.4°F | Ht 63.0 in | Wt 200.0 lb

## 2014-01-09 DIAGNOSIS — L723 Sebaceous cyst: Secondary | ICD-10-CM

## 2014-01-09 DIAGNOSIS — L659 Nonscarring hair loss, unspecified: Secondary | ICD-10-CM

## 2014-01-09 LAB — HEPATIC FUNCTION PANEL
ALT: 24 U/L (ref 0–35)
AST: 19 U/L (ref 0–37)
Albumin: 4 g/dL (ref 3.5–5.2)
Alkaline Phosphatase: 64 U/L (ref 39–117)
Bilirubin, Direct: 0 mg/dL (ref 0.0–0.3)
Total Bilirubin: 0.2 mg/dL (ref 0.2–1.2)
Total Protein: 7 g/dL (ref 6.0–8.3)

## 2014-01-09 NOTE — Patient Instructions (Addendum)
-  warm compresses twice daily  -follow up with your doctor office in 1-2 days to recheck - sooner if worsening

## 2014-01-09 NOTE — Progress Notes (Addendum)
No chief complaint on file.   HPI:   Deborra Phegley is a 43 yo F patient of Dr. Asa Lente with a PMH listed of depression, anxiety, fibromyalgia, goiter, migraine here for an acute visit for:  1) SKin Lesion -noticed yesterday on R mid back -husband whom is PA said might be shingles -mildly tender -denies: fevers, malaise, pruritis -allergic to almost every abx   ROS: See pertinent positives and negatives per HPI.  Past Medical History  Diagnosis Date  . History of chicken pox   . Depression   . History of migraine   . History of UTI   . Fibromyalgia   . Anxiety   . Endometriosis   . Fibroid   . 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 Asa Lente    Past Surgical History  Procedure Laterality Date  . Tonsillectomy  1988  . Abdominal hysterectomy  2011  . Breast biopsy  2007    dense breast    Family History  Problem Relation Age of Onset  . Arthritis Mother   . Hyperlipidemia Mother   . Hypertension Mother   . Stroke Mother   . Mental illness Mother   . Arthritis Father   . Hyperlipidemia Father   . Hypertension Father   . Diabetes Father   . Heart disease Maternal Grandmother   . Stroke Maternal Grandmother   . Prostate cancer Paternal Grandfather   . Breast cancer Cousin   . Hypertension Brother     History   Social History  . Marital Status: Married    Spouse Name: N/A    Number of Children: N/A  . Years of Education: N/A   Social History Main Topics  . Smoking status: Never Smoker   . Smokeless tobacco: None  . Alcohol Use: No  . Drug Use: No  . Sexual Activity: Yes    Partners: Male     Comment: Hyst   Other Topics Concern  . None   Social History Narrative  . None    Current outpatient prescriptions:cetirizine (ZYRTEC) 10 MG tablet, Take 10 mg by mouth daily., Disp: , Rfl: ;  MELATONIN PO, Take by mouth daily., Disp: , Rfl: ;  triamcinolone (NASACORT ALLERGY 24HR) 55 MCG/ACT AERO nasal inhaler, Place 2 sprays into the  nose daily., Disp: , Rfl:   EXAM:  Filed Vitals:   01/09/14 1543  BP: 102/64  Pulse: 82  Temp: 98.4 F (36.9 C)    Body mass index is 35.44 kg/(m^2).  GENERAL: vitals reviewed and listed above, alert, oriented, appears well hydrated and in no acute distress  HEENT: atraumatic, conjunttiva clear, no obvious abnormalities on inspection of external nose and ears  NECK: no obvious masses on inspection  SKIN: small subcutaneous nodule with central punctate - white material expressed spontaneously with minimal palpation, no erythema or warmth  MS: moves all extremities without noticeable abnormality  PSYCH: pleasant and cooperative, no obvious depression or anxiety  ASSESSMENT AND PLAN:  Discussed the following assessment and plan:  Sebaceous cyst - Plan: Wound culture  Alopecia - Plan: Hepatic function panel, DHEA-sulfate, Testosterone, free, total  -we discussed possible serious and likely etiologies, workup and treatment, treatment risks and return precautions -likely inflamed epidermoid cyst, culture of expressed material sent -she is adamant about not taking any antibiotics, doesn't really want to do I/D -culture sent, though likely not infected, advised follow up in 1-2 days to recheck and return precautions  -of course, we advised Sonda  to return or notify a doctor immediately if symptoms worsen or persist or new concerns arise.  -Patient advised to return or notify a doctor immediately if symptoms worsen or persist or new concerns arise.  Patient Instructions  -warm compresses twice daily  -follow up with your doctor office in 1-2 days to recheck - sooner if worsening     Isadore Palecek R.

## 2014-01-09 NOTE — Progress Notes (Signed)
Pre visit review using our clinic review tool, if applicable. No additional management support is needed unless otherwise documented below in the visit note. 

## 2014-01-10 ENCOUNTER — Ambulatory Visit (INDEPENDENT_AMBULATORY_CARE_PROVIDER_SITE_OTHER): Payer: 59 | Admitting: Internal Medicine

## 2014-01-10 ENCOUNTER — Encounter: Payer: Self-pay | Admitting: Internal Medicine

## 2014-01-10 VITALS — BP 110/68 | HR 85 | Temp 97.4°F | Resp 12 | Ht 63.0 in | Wt 199.0 lb

## 2014-01-10 DIAGNOSIS — E049 Nontoxic goiter, unspecified: Secondary | ICD-10-CM

## 2014-01-10 DIAGNOSIS — L659 Nonscarring hair loss, unspecified: Secondary | ICD-10-CM

## 2014-01-10 LAB — TESTOSTERONE, FREE, TOTAL, SHBG
Sex Hormone Binding: 22 nmol/L (ref 18–114)
Testosterone, Free: 8.7 pg/mL — ABNORMAL HIGH (ref 0.6–6.8)
Testosterone-% Free: 2.2 % (ref 0.4–2.4)
Testosterone: 39 ng/dL (ref 10–70)

## 2014-01-10 LAB — DHEA-SULFATE: DHEA-SO4: 83 ug/dL (ref 19–231)

## 2014-01-10 LAB — VITAMIN B12: Vitamin B-12: 349 pg/mL (ref 211–911)

## 2014-01-10 LAB — FERRITIN: Ferritin: 67.4 ng/mL (ref 10.0–291.0)

## 2014-01-10 NOTE — Progress Notes (Addendum)
Patient ID: [name], female   DOB: [birth year], 43 y.o.   MRN: [id number]   HPI  [name] is a 43 y.o.-year-old female, referred by her PCP, Dr. Asa Lente, in consultation for hair loss and MNG.  Hair loss: - started ~2000, it became worse when on Wellbutrin >> then even worse after she increased dose of Wellbutrin 5 mo ago. She stopped Wellbutrin in 10/2013. She noticed that the hair does not come out in clumps anymore after stopping the med. She feels tired off Wellbutrin.  Reviewed pertinent labs: Component     Latest Ref Rng 01/09/2014  DHEA-SO4     19 - 231 ug/dL 83  Testosterone     10 - 70 ng/dL 39   She has hot flushes and sweating "for years". She started Wellbutrin last summer >> lost 30 lbs - then gained 10 lbs back after stopping Wellbutrin. Also retains fluid.  C/o HAs.   MNG: Thyroid U/S: MNG  Right thyroid lobe: 5.3 x 2.2 x 1.9 cm. Cystic/minimally solid nodule measures 1.3 x 1.0 x 0.6 cm at the mid right thyroid.  Right mid thyroid lobe nodule, solid, 1.2 x 0.9 x 0.7 cm.  Right inferior thyroid lobe, predominantly cystic, 1.0 x 1.0 x 0.8 cm  Left thyroid lobe: 5.7 x 2.1 x 1.9 cm. Upper pole solid nodule measures 2.3 x 1.5 x 1.4 cm   Isthmus Thickness: 0.9 cm. Solid nodule measures 4.4 x 3.3 x 2.0 cm   Lymphadenopathy None visualized.  I reviewed pt's thyroid tests: Lab Results  Component Value Date   TSH 0.71 09/17/2013   FREET4 0.82 09/17/2013    Pt denies feeling nodules in neck, + hoarseness (worse over the summer), no dysphagia/odynophagia, no SOB with lying down. She had sinus congestion. She snores. Her insurance does not pay for a sleep study (1200$).  Pt c/o: - + fatigue - + both heat intolerance/cold intolerance - no tremors - no palpitations - + both anxiety/depression - no hyperdefecation/+ constipation - + weight gain - no dry skin, but has itching  Pt does  have a FH of thyroid ds in father and MGM. No FH of thyroid cancer. No h/o radiation  tx to head or neck.  She also has a history of fibromyalgia >> better on Wellbutrin, but now worse after she stopped.   ROS: Constitutional: + see HPI, + poor sleep Eyes: + blurry vision, no xerophthalmia ENT: + sore throat, + nodules palpated in throat, no dysphagia/odynophagia, + hoarseness Cardiovascular: no CP/SOB/palpitations/+ hand and leg swelling Respiratory: no cough/SOB Gastrointestinal: no N/V/D/+ C Musculoskeletal: no muscle/+ joint aches Skin: no rashes Neurological: no tremors/numbness/tingling/dizziness, + HA Psychiatric: + both: depression/anxiety  Past Medical History  Diagnosis Date  . History of chicken pox   . Depression   . History of migraine   . History of UTI   . Fibromyalgia   . Anxiety   . Endometriosis   . Fibroid   . 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 Asa Lente   Past Surgical History  Procedure Laterality Date  . Tonsillectomy  1988  . Abdominal hysterectomy  2011  . Breast biopsy  2007    dense breast   History   Social History  . Marital Status: Married    Spouse Name: N/A    Number of Children: 22, 33 y/o   Occupational History  . Speech patholigst   Social History Main Topics  . Smoking status: Never Smoker   .  Smokeless tobacco: Not on file  . Alcohol Use: No  . Drug Use: No  . Sexual Activity: Yes    Partners: Male     Comment: Hyst   Current Outpatient Prescriptions on File Prior to Visit  Medication Sig Dispense Refill  . cetirizine (ZYRTEC) 10 MG tablet Take 10 mg by mouth daily.      Marland Kitchen MELATONIN PO Take by mouth daily.      Marland Kitchen triamcinolone (NASACORT ALLERGY 24HR) 55 MCG/ACT AERO nasal inhaler Place 2 sprays into the nose daily.       No current facility-administered medications on file prior to visit.   Allergies  Allergen Reactions  . Erythromycin   . Keflex [Cephalexin]   . Penicillins   . Sulfa Antibiotics    Family History  Problem Relation Age of Onset  . Arthritis Mother   .  Hyperlipidemia Mother   . Hypertension Mother   . Stroke Mother   . Mental illness Mother   . Arthritis Father   . Hyperlipidemia Father   . Hypertension Father   . Diabetes Father   . Heart disease Maternal Grandmother   . Stroke Maternal Grandmother   . Prostate cancer Paternal Grandfather   . Breast cancer Cousin   . Hypertension Brother    PE: BP 110/68  Pulse 85  Temp(Src) 97.4 F (36.3 C) (Oral)  Resp 12  Ht 5\' 3"  (1.6 m)  Wt 199 lb (90.266 kg)  BMI 35.26 kg/m2  SpO2 97% Wt Readings from Last 3 Encounters:  01/10/14 199 lb (90.266 kg)  01/09/14 200 lb (90.719 kg)  12/23/13 200 lb (90.719 kg)   Constitutional: overweight, in NAD Eyes: PERRLA, EOMI, no exophthalmos ENT: moist mucous membranes, central thyromegaly, no cervical lymphadenopathy Cardiovascular: RRR, No MRG Respiratory: CTA B Gastrointestinal: abdomen soft, NT, ND, BS+ Musculoskeletal: no deformities, strength intact in all 4;  Skin: moist, warm, no rashes; generalized alopecia, especially vertex; no acne, no hirsutism Neurological: no tremor with outstretched hands, DTR normal in all 4  ASSESSMENT: 1. Hair loss  2. MNG  PLAN: 1. Hair loss We discussed about possible causes of alopecia:  Hypothyroidism - not, per TFTs yesterday; will check thyroid Abs today  Pregnancy - had hysterectomy  Menopause - unclear since she had a hysterectomy  Poor diet   Stress   Vitamin deficiencies - will check B12 vitamin level; is not taking excess vitamin A   Micronutrient deficiencies - will check a ferritin to see if this is low, in this case she would benefit from iron supplementation; will also check zinc since deficiency was also associated with hair loss  Advised her to get all essential amino acids from the diet (especially L-lysine, since a deficiency of this amino acid has been living with hair loss)  Medications (off Wellbutrin now) - Advised to try to use scalp concealer - she tried but does  not like it - advised not to use the curling iron anymore - hair feels very dry and burnt  2. MNG  - I reviewed the report of her thyroid ultrasound along with the patient (computer system was down, could not review images). I pointed out that the dominant nodules are large, this being a risk factor for cancer. Otherwise, the nodules are without calcifications, without internal blood flow and well delimited from surrounding tissue. Pt does not have a thyroid cancer family history or a personal history of RxTx to head/neck. All these would favor benignity. - the only way that  we can tell exactly if it is cancer or not is by doing a thyroid biopsy (FNA). I suggested the Bx the 2 dominant nodules. I explained what the test entails. - I explained that this is not cancer, we can continue to follow her on a yearly basis, and check another ultrasound in another year or 2. - she should let me know if she develops neck compression symptoms, in that case, we might need to do either lobectomy or thyroidectomy - I did explain that, while thyroid surgery is not a complicated one, it still can have side effects and also she might have a risk of ~25% of becoming hypothyroid after hemithyroidectomy.  - patient decided to have the FNA done now >> I ordered this.  - I'll see her back in a year, assuming her FNA is normal. If FNA abnormal, we will meet sooner.  Return in about 1 year (around 01/11/2015).   Component     Latest Ref Rng 01/10/2014  Ferritin     10.0 - 291.0 ng/mL 67.4  Vitamin B-12     211 - 911 pg/mL 349  Methylmalonic Acid, Quant     87 - 318 nmol/L 97  Thyroid Peroxidase Antibody     <9 IU/mL <1  Thyroglobulin Ab     <2 IU/mL <1  Zinc     60 - 130 mcg/dL 79  Labs normal >> I did advise her to discuss with dermatologist about Propecia if they are normal.  Adequacy Reason Satisfactory For Evaluation. Diagnosis THYROID, FINE NEEDLE ASPIRATION, LEFT UPPER POLE (SPECIMEN 1 OF 2  COLLECTED 01-21-2014) BENIGN. FINDINGS CONSISTENT WITH A BENIGN FOLLICULAR NODULE (BETHESDA CATEGORY II). Enid Cutter MD Pathologist, Electronic Signature (Case signed 01/22/2014) Specimen Clinical Information Goiter, LUP solid nodule 2.3 x 1.5 x 1.4cm Source Thyroid, Fine Needle Aspiration, LUP, (Specimen 1 of 2, collected on 01/21/2014)  Adequacy Reason Satisfactory For Evaluation. Diagnosis THYROID, FINE NEEDLE ASPIRATION, ISTHMUS (SPECIMEN 2 OF 2 COLLECTED 01-21-2014) BENIGN. FINDINGS CONSISTENT WITH A BENIGN FOLLICULAR NODULE (BETHESDA CATEGORY II). Enid Cutter MD Pathologist, Electronic Signature (Case signed 01/22/2014) Specimen Clinical Information Goiter, Solid nodule isthmus 4.4 x 3.3 x 2.0cm Source Thyroid, Fine Needle Aspiration, Isthmus, (Specimen 2 of 2, collected on 01/21/2014)  Benign thyroid nodules  - continue plan as above.

## 2014-01-10 NOTE — Patient Instructions (Signed)
Please stop at the lab. You can try scalp concealer. May need Propecia if all workup is negative.  You will be called with the schedule for the thyroid biopsy.  Please return in 1 year.   Thyroid Biopsy The thyroid gland is a butterfly-shaped gland situated in the front of the neck. It produces hormones which affect metabolism, growth and development, and body temperature. A thyroid biopsy is a procedure in which small samples of tissue or fluid are removed from the thyroid gland or mass and examined under a microscope. This test is done to determine the cause of thyroid problems, such as infection, cancer, or other thyroid problems. There are 2 ways to obtain samples: 1. Fine needle biopsy. Samples are removed using a thin needle inserted through the skin and into the thyroid gland or mass. 2. Open biopsy. Samples are removed after a cut (incision) is made through the skin. LET YOUR CAREGIVER KNOW ABOUT:   Allergies.  Medications taken including herbs, eye drops, over-the-counter medications, and creams.  Use of steroids (by mouth or creams).  Previous problems with anesthetics or numbing medicine.  Possibility of pregnancy, if this applies.  History of blood clots (thrombophlebitis).  History of bleeding or blood problems.  Previous surgery.  Other health problems. RISKS AND COMPLICATIONS  Bleeding from the site. The risk of bleeding is higher if you have a bleeding disorder or are taking any blood thinning medications (anticoagulants).  Infection.  Injury to structures near the thyroid gland. BEFORE THE PROCEDURE  This is a procedure that can be done as an outpatient. Confirm the time that you need to arrive for your procedure. Confirm whether there is a need to fast or withhold any medications. A blood sample may be done to determine your blood clotting time. Medicine may be given to help you relax (sedative). PROCEDURE Fine needle biopsy. You will be awake during the  procedure. You may be asked to lie on your back with your head tipped backward to extend your neck. Let your caregiver know if you cannot tolerate the positioning. An area on your neck will be cleansed. A needle is inserted through the skin of your neck. You may feel a mild discomfort during this procedure. You may be asked to avoid coughing, talking, swallowing, or making sounds during some portions of the procedure. The needle is withdrawn once tissue or fluid samples have been removed. Pressure may be applied to the neck to reduce swelling and ensure that bleeding has stopped. The samples will be sent for examination.  Open biopsy. You will be given general anesthesia. You will be asleep during the procedure. An incision is made in your neck. A sample of thyroid tissue or the mass is removed. The tissue sample or mass will be sent for examination. The sample or mass may be examined during the biopsy. If the sample or mass contains cancer cells, some or all of the thyroid gland may be removed. The incision is closed with stitches. AFTER THE PROCEDURE  Your recovery will be assessed and monitored. If there are no problems, as an outpatient, you should be able to go home shortly after the procedure. If you had a fine needle biopsy:  You may have soreness at the biopsy site for 1 to 2 days. If you had an open biopsy:   You may have soreness at the biopsy site for 3 to 4 days.  You may have a hoarse voice or sore throat for 1 to 2 days. Obtaining  the Test Results It is your responsibility to obtain your test results. Do not assume everything is normal if you have not heard from your caregiver or the medical facility. It is important for you to follow up on all of your test results. HOME CARE INSTRUCTIONS   Keeping your head raised on a pillow when you are lying down may ease biopsy site discomfort.  Supporting the back of your head and neck with both hands as you sit up from a lying position may  ease biopsy site discomfort.  Only take over-the-counter or prescription medicines for pain, discomfort, or fever as directed by your caregiver.  Throat lozenges or gargling with warm salt water may help to soothe a sore throat. SEEK IMMEDIATE MEDICAL CARE IF:   You have severe bleeding from the biopsy site.  You have difficulty swallowing.  You have a fever.  You have increased pain, swelling, redness, or warmth at the biopsy site.  You notice pus coming from the biopsy site.  You have swollen glands (lymph nodes) in your neck. Document Released: 01/23/2007 Document Revised: 07/23/2012 Document Reviewed: 06/20/2013 [facility] Patient Information 2015 [place], [place]. This information is not intended to replace advice given to you by your health care provider. Make sure you discuss any questions you have with your health care provider.

## 2014-01-11 LAB — THYROGLOBULIN ANTIBODY: Thyroglobulin Ab: <1 IU/mL (ref <2)

## 2014-01-11 LAB — THYROID PEROXIDASE ANTIBODY: Thyroperoxidase Ab SerPl-aCnc: <1 IU/mL (ref <9)

## 2014-01-12 LAB — WOUND CULTURE
Gram Stain: NONE SEEN
Organism ID, Bacteria: NO GROWTH

## 2014-01-13 LAB — METHYLMALONIC ACID, SERUM: Methylmalonic Acid, Quant: 97 nmol/L (ref 87–318)

## 2014-01-14 LAB — ZINC: Zinc: 79 ug/dL (ref 60–130)

## 2014-01-20 ENCOUNTER — Telehealth: Payer: Self-pay | Admitting: Internal Medicine

## 2014-01-20 NOTE — Telephone Encounter (Signed)
Patient ask if she could get some Xanax call into her pharmacy for her anxiety she is worried and scared and nervous about appt topmorrow, and she need something today.

## 2014-01-20 NOTE — Telephone Encounter (Signed)
Pt is schedule to have a thyroid biopsy tomorrow. Wanting something for her nerves...Johny Chess

## 2014-01-20 NOTE — Telephone Encounter (Signed)
Pt needs rx for nerves

## 2014-01-21 ENCOUNTER — Other Ambulatory Visit (HOSPITAL_COMMUNITY)
Admission: RE | Admit: 2014-01-21 | Discharge: 2014-01-21 | Disposition: A | Discharge status: Home / Self Care | Payer: 59 | Source: Ambulatory Visit | Attending: Interventional Radiology | Admitting: Interventional Radiology

## 2014-01-21 ENCOUNTER — Ambulatory Visit
Admission: RE | Admit: 2014-01-21 | Discharge: 2014-01-21 | Disposition: A | Discharge status: Home / Self Care | Payer: 59 | Source: Ambulatory Visit | Attending: Internal Medicine | Admitting: Internal Medicine

## 2014-01-21 DIAGNOSIS — E041 Nontoxic single thyroid nodule: Secondary | ICD-10-CM | POA: Insufficient documentation

## 2014-01-21 NOTE — Telephone Encounter (Signed)
Called pt no answer LMOM with md response.../lmb 

## 2014-01-21 NOTE — Telephone Encounter (Signed)
Pt need to follow up with her pscy MD on same

## 2014-02-10 ENCOUNTER — Encounter: Payer: Self-pay | Admitting: Internal Medicine

## 2014-03-25 ENCOUNTER — Telehealth: Payer: Self-pay | Admitting: Internal Medicine

## 2014-03-25 NOTE — Telephone Encounter (Signed)
Pt went off Wellbutrin for possible hair loss but its not the Wellbutrin causing this, pt wants to go back on this med.  She also wants to see Dr Asa Lente sooner than her sched appt in March to discuss chr fatigue and fibromyalgia.  [phone]

## 2014-03-26 MED ORDER — BUPROPION HCL ER (XL) 300 MG PO TB24: 300.0000 mg | ORAL_TABLET | Freq: Every day | ORAL | Status: DC

## 2014-03-26 NOTE — Telephone Encounter (Signed)
1) ok to resume wellbutrin - erx done 2) i apologize for not having availabilty due to my adminstrative role, and i encourage pt to see a partner if needed between now and 06/2014 Thanks!

## 2014-03-27 NOTE — Telephone Encounter (Signed)
LVM for pt to call back.

## 2014-04-16 ENCOUNTER — Other Ambulatory Visit: Payer: Self-pay

## 2014-04-16 DIAGNOSIS — Z1231 Encounter for screening mammogram for malignant neoplasm of breast: Secondary | ICD-10-CM

## 2014-04-30 ENCOUNTER — Ambulatory Visit
Admission: RE | Admit: 2014-04-30 | Discharge: 2014-04-30 | Disposition: A | Discharge status: Home / Self Care | Payer: 59 | Source: Ambulatory Visit

## 2014-04-30 DIAGNOSIS — Z1231 Encounter for screening mammogram for malignant neoplasm of breast: Secondary | ICD-10-CM

## 2014-05-30 ENCOUNTER — Ambulatory Visit (INDEPENDENT_AMBULATORY_CARE_PROVIDER_SITE_OTHER): Payer: 59 | Admitting: Family

## 2014-05-30 ENCOUNTER — Ambulatory Visit: Payer: 59 | Admitting: Family

## 2014-05-30 ENCOUNTER — Encounter: Payer: Self-pay | Admitting: Family

## 2014-05-30 VITALS — BP 144/90 | HR 83 | Temp 98.2°F | Resp 18 | Ht 63.0 in | Wt 204.8 lb

## 2014-05-30 DIAGNOSIS — R51 Headache: Secondary | ICD-10-CM

## 2014-05-30 DIAGNOSIS — R519 Headache, unspecified: Secondary | ICD-10-CM

## 2014-05-30 MED ORDER — SUMATRIPTAN SUCCINATE 50 MG PO TABS: ORAL_TABLET | ORAL | Status: DC

## 2014-05-30 NOTE — Assessment & Plan Note (Signed)
Symptoms and exam consistent with a mixed migraine headache. Patient declined in office Toradol injection and prescription for phenergan. Start imitrex. May also consider benedryl as needed for nausea and sleep. Follow up if symptoms worsen or fail to improve. Seek emergency medical care if worst headache of her life develops.

## 2014-05-30 NOTE — Patient Instructions (Addendum)
Thank you for choosing [facility].  Summary/Instructions:  Your prescription(s) have been submitted to your pharmacy or been printed and provided for you. Please take as directed and contact our office if you believe you are having problem(s) with the medication(s) or have any questions.  If your symptoms worsen or fail to improve, please contact our office for further instruction, or in case of emergency go directly to the emergency room at the closest medical facility.    Migraine Headache A migraine headache is an intense, throbbing pain on one or both sides of your head. A migraine can last for 30 minutes to several hours. CAUSES  The exact cause of a migraine headache is not always known. However, a migraine may be caused when nerves in the brain become irritated and release chemicals that cause inflammation. This causes pain. Certain things may also trigger migraines, such as:  Alcohol.  Smoking.  Stress.  Menstruation.  Aged cheeses.  Foods or drinks that contain nitrates, glutamate, aspartame, or tyramine.  Lack of sleep.  Chocolate.  Caffeine.  Hunger.  Physical exertion.  Fatigue.  Medicines used to treat chest pain (nitroglycerine), birth control pills, estrogen, and some blood pressure medicines. SIGNS AND SYMPTOMS  Pain on one or both sides of your head.  Pulsating or throbbing pain.  Severe pain that prevents daily activities.  Pain that is aggravated by any physical activity.  Nausea, vomiting, or both.  Dizziness.  Pain with exposure to bright lights, loud noises, or activity.  General sensitivity to bright lights, loud noises, or smells. Before you get a migraine, you may get warning signs that a migraine is coming (aura). An aura may include:  Seeing flashing lights.  Seeing bright spots, halos, or zigzag lines.  Having tunnel vision or blurred vision.  Having feelings of numbness or tingling.  Having trouble  talking.  Having muscle weakness. DIAGNOSIS  A migraine headache is often diagnosed based on:  Symptoms.  Physical exam.  A CT scan or MRI of your head. These imaging tests cannot diagnose migraines, but they can help rule out other causes of headaches. TREATMENT Medicines may be given for pain and nausea. Medicines can also be given to help prevent recurrent migraines.  HOME CARE INSTRUCTIONS  Only take over-the-counter or prescription medicines for pain or discomfort as directed by your health care provider. The use of long-term narcotics is not recommended.  Lie down in a dark, quiet room when you have a migraine.  Keep a journal to find out what may trigger your migraine headaches. For example, write down:  What you eat and drink.  How much sleep you get.  Any change to your diet or medicines.  Limit alcohol consumption.  Quit smoking if you smoke.  Get 7-9 hours of sleep, or as recommended by your health care provider.  Limit stress.  Keep lights dim if bright lights bother you and make your migraines worse. SEEK IMMEDIATE MEDICAL CARE IF:   Your migraine becomes severe.  You have a fever.  You have a stiff neck.  You have vision loss.  You have muscular weakness or loss of muscle control.  You start losing your balance or have trouble walking.  You feel faint or pass out.  You have severe symptoms that are different from your first symptoms. MAKE SURE YOU:   Understand these instructions.  Will watch your condition.  Will get help right away if you are not doing well or get worse. Document Released: 03/28/2005  Document Revised: 08/12/2013 Document Reviewed: 12/03/2012 [facility] Patient Information 2015 [place], [place]. This information is not intended to replace advice given to you by your health care provider. Make sure you discuss any questions you have with your health care provider.

## 2014-05-30 NOTE — Progress Notes (Signed)
   Subjective:    Patient ID: [name], female    DOB: [birth year], 44 y.o.   MRN: [id number]  Chief Complaint  Patient presents with  . Headache    x8 days, has tried everything OTC to knock the headache out says it just takes a little pain away, has thrown up a couple times due to the pain, thinks its a migraine, has seen spots and says she has had blurred vision    HPI:  [name] is a 44 y.o. female who presents today for an acute visit.  This is a new problem. Associated symptoms of headache with blurred vision, nausea, and seeing spots has been going on for about 8 days. Indicates that is is pretty dull and pressure and is located with around her eyes. There is sensitivity to light and sound. Intensity has been enough to make her vomit. Has tried several OTC medications and nothing has seemed to provide relief but does relieve some of the pain.    Allergies  Allergen Reactions  . Erythromycin   . Keflex [Cephalexin]   . Penicillins   . Sulfa Antibiotics     Current Outpatient Prescriptions on File Prior to Visit  Medication Sig Dispense Refill  . cetirizine (ZYRTEC) 10 MG tablet Take 10 mg by mouth daily.    Marland Kitchen MELATONIN PO Take by mouth daily.    Marland Kitchen triamcinolone (NASACORT ALLERGY 24HR) 55 MCG/ACT AERO nasal inhaler Place 2 sprays into the nose daily.     No current facility-administered medications on file prior to visit.    Past Medical History  Diagnosis Date  . History of chicken pox   . Depression   . History of migraine   . History of UTI   . Fibromyalgia   . Anxiety   . Endometriosis   . Fibroid   . 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 Asa Lente    Review of Systems  Gastrointestinal: Positive for nausea.  Neurological: Positive for headaches. Negative for numbness.      Objective:    BP 144/90 mmHg  Pulse 83  Temp(Src) 98.2 F (36.8 C) (Oral)  Resp 18  Ht 5\' 3"  (1.6 m)  Wt 204 lb 12.8 oz (92.897 kg)  BMI 36.29 kg/m2  SpO2  98% Nursing note and vital signs reviewed.  Physical Exam  Constitutional: She is oriented to person, place, and time. She appears well-developed and well-nourished. No distress.  Cardiovascular: Normal rate, regular rhythm, normal heart sounds and intact distal pulses.   Pulmonary/Chest: Effort normal and breath sounds normal.  Neurological: She is alert and oriented to person, place, and time. No cranial nerve deficit. She exhibits normal muscle tone. Coordination normal.  Skin: Skin is warm and dry.  Psychiatric: She has a normal mood and affect. Her behavior is normal. Judgment and thought content normal.       Assessment & Plan:

## 2014-05-30 NOTE — Progress Notes (Signed)
Pre visit review using our clinic review tool, if applicable. No additional management support is needed unless otherwise documented below in the visit note. 

## 2014-06-02 ENCOUNTER — Encounter: Payer: Self-pay | Admitting: Internal Medicine

## 2014-06-02 MED ORDER — ATENOLOL 50 MG PO TABS: 50.0000 mg | ORAL_TABLET | Freq: Every day | ORAL | Status: DC

## 2014-06-06 ENCOUNTER — Telehealth: Payer: Self-pay | Admitting: Family

## 2014-06-06 MED ORDER — SUMATRIPTAN SUCCINATE 100 MG PO TABS: ORAL_TABLET | ORAL | Status: DC

## 2014-06-06 NOTE — Telephone Encounter (Signed)
notified patient

## 2014-06-06 NOTE — Telephone Encounter (Signed)
Patient is requesting refill on imitrex for 100mg  to be sent to [facility].  Patient states insurance will only cover 9 pills instead of 10 on a script.  She states Marya Amsler suggested 100mg .  She wanted 50mg .  She realized that the 100mg  would have been better.

## 2014-06-06 NOTE — Telephone Encounter (Signed)
Medication refilled

## 2014-06-23 ENCOUNTER — Encounter: Payer: Self-pay | Admitting: Internal Medicine

## 2014-06-23 ENCOUNTER — Ambulatory Visit (INDEPENDENT_AMBULATORY_CARE_PROVIDER_SITE_OTHER): Payer: 59 | Admitting: Internal Medicine

## 2014-06-23 VITALS — BP 122/78 | HR 67 | Temp 97.6°F | Resp 16 | Wt 207.0 lb

## 2014-06-23 DIAGNOSIS — Z0000 Encounter for general adult medical examination without abnormal findings: Secondary | ICD-10-CM | POA: Diagnosis not present

## 2014-06-23 DIAGNOSIS — E049 Nontoxic goiter, unspecified: Secondary | ICD-10-CM

## 2014-06-23 DIAGNOSIS — R239 Unspecified skin changes: Secondary | ICD-10-CM | POA: Diagnosis not present

## 2014-06-23 DIAGNOSIS — M797 Fibromyalgia: Secondary | ICD-10-CM

## 2014-06-23 MED ORDER — DULOXETINE HCL 30 MG PO CPEP: 30.0000 mg | ORAL_CAPSULE | Freq: Every day | ORAL | Status: DC

## 2014-06-23 NOTE — Progress Notes (Signed)
Pre visit review using our clinic review tool, if applicable. No additional management support is needed unless otherwise documented below in the visit note. 

## 2014-06-23 NOTE — Progress Notes (Signed)
Subjective:    Patient ID: [name], female    DOB: 1970-11-20, 44 y.o.   MRN: [id number]  HPI  patient is here today for annual physical. Patient feels well in genral.  Also reviewed chronic conditions, current concerns and interval events  Past Medical History  Diagnosis Date  . History of chicken pox   . Depression   . History of migraine   . History of UTI   . Fibromyalgia   . Anxiety   . Endometriosis   . Fibroid   . Right thyroid nodule     endo eval summer 2015 - bx: benign follicular nodule   Family History  Problem Relation Age of Onset  . Arthritis Mother   . Hyperlipidemia Mother   . Hypertension Mother   . Stroke Mother   . Mental illness Mother   . Arthritis Father   . Hyperlipidemia Father   . Hypertension Father   . Diabetes Father   . Heart disease Maternal Grandmother   . Stroke Maternal Grandmother   . Prostate cancer Paternal Grandfather   . Breast cancer Cousin   . Hypertension Brother    History  Substance Use Topics  . Smoking status: Never Smoker   . Smokeless tobacco: Not on file  . Alcohol Use: No    Review of Systems  Constitutional: Negative for fatigue and unexpected weight change.  Respiratory: Negative for cough, shortness of breath and wheezing.   Cardiovascular: Negative for chest pain, palpitations and leg swelling.  Gastrointestinal: Negative for nausea, abdominal pain and diarrhea.  Musculoskeletal: Positive for myalgias (chronic FM).  Neurological: Negative for dizziness, weakness, light-headedness and headaches (improved with atenolol and prn imitrex).  Psychiatric/Behavioral: Negative for dysphoric mood. The patient is not nervous/anxious.   All other systems reviewed and are negative.      Objective:    Physical Exam  Constitutional: She is oriented to person, place, and time. She appears well-developed and well-nourished. No distress.  obese  HENT:  Head: Normocephalic and atraumatic.  Right Ear: External  ear normal.  Left Ear: External ear normal.  Nose: Nose normal.  Mouth/Throat: Oropharynx is clear and moist. No oropharyngeal exudate.  Eyes: EOM are normal. Pupils are equal, round, and reactive to light. Right eye exhibits no discharge. Left eye exhibits no discharge. No scleral icterus.  Neck: Normal range of motion. Neck supple. No JVD present. No tracheal deviation present. No thyromegaly present.  R goiter, nontender - unchanged  Cardiovascular: Normal rate, regular rhythm, normal heart sounds and intact distal pulses.  Exam reveals no friction rub.   No murmur heard. Pulmonary/Chest: Effort normal and breath sounds normal. No respiratory distress. She has no wheezes. She has no rales. She exhibits no tenderness.  Abdominal: Soft. Bowel sounds are normal. She exhibits no distension and no mass. There is no tenderness. There is no rebound and no guarding.  Genitourinary:  Defer to gyn  Musculoskeletal: Normal range of motion.  No gross deformities  Lymphadenopathy:    She has no cervical adenopathy.  Neurological: She is alert and oriented to person, place, and time. She has normal reflexes. No cranial nerve deficit.  Skin: Skin is warm and dry. Bruising noted. No rash noted. She is not diaphoretic. There is erythema.  Discoloration over L thenar region  Psychiatric: She has a normal mood and affect. Her behavior is normal. Judgment and thought content normal.  Nursing note and vitals reviewed.   BP 122/78 mmHg  Pulse 67  Temp(Src)  97.6 F (36.4 C) (Oral)  Resp 16  Wt 207 lb (93.895 kg)  SpO2 93% Wt Readings from Last 3 Encounters:  06/23/14 207 lb (93.895 kg)  05/30/14 204 lb 12.8 oz (92.897 kg)  01/10/14 199 lb (90.266 kg)    Lab Results  Component Value Date   WBC 7.5 09/17/2013   HGB 14.3 09/17/2013   HCT 41.8 09/17/2013   PLT 226.0 09/17/2013   GLUCOSE 96 09/17/2013   ALT 24 01/09/2014   AST 19 01/09/2014   NA 139 09/17/2013   K 4.3 09/17/2013   CL 105  09/17/2013   CREATININE 0.8 09/17/2013   BUN 10 09/17/2013   CO2 27 09/17/2013   TSH 0.71 09/17/2013    Mm Screening Breast Tomo Bilateral  05/01/2014   CLINICAL DATA:  Screening.  EXAM: DIGITAL SCREENING BILATERAL MAMMOGRAM WITH 3D TOMO WITH CAD  COMPARISON:  Previous exam(s).  ACR Breast Density Category c: The breast tissue is heterogeneously dense, which may obscure small masses.  FINDINGS: There are no findings suspicious for malignancy. Images were processed with CAD.  IMPRESSION: No mammographic evidence of malignancy. A result letter of this screening mammogram will be mailed directly to the patient.  RECOMMENDATION: Screening mammogram in one year. (Code:SM-B-01Y)  BI-RADS CATEGORY  1: Negative.   Electronically Signed   By: Lovey Newcomer M.D.   On: 05/01/2014 12:48       Assessment & Plan:   Z00.00/CPX - Patient has been counseled on age-appropriate routine health concerns for screening and prevention. These are reviewed and up-to-date. Immunizations are up-to-date or declined. Labs ordered and reviewed.  Skin discoloration, chronic with new erythematous changes over left thenar region. Patient requests dermatology evaluation for same. We'll make referral now, requests different provider/practice than she whom provided hair loss evaluation last year  Problem List Items Addressed This Visit    Fibromyalgia    chronic symptoms - initially much improved with wellbutrin but self DC'd 10/2013 due to concern for ?hair loss side effects  Never began recommended prior Lyrica trial due to feared side effects  Also poorly tolerant of prior Lexapro and low dose TCA - nortrip Pt now ready to start SNRI Cymbalta - we reviewed potential risk/benefit and possible side effects - pt understands and agrees to same  Pt will send MyC msg in 4-8 wks with update on response      Goiter    Reports remote "iodine" treatment for same >5 years ago - significant size R nodule on [place] eval by endo 01/2014  Emeterio Reeve) bx [id number]: benign follicular nodule Pt still interested in surgical excision due to size and cosmetic appearance - will let me know who at [facility] she would like to see for same       Other Visit Diagnoses    Routine general medical examination at a health care facility    -  Primary    Relevant Orders    Basic metabolic panel    CBC with Differential/Platelet    Hepatic function panel    Lipid panel    TSH    Urinalysis, Routine w reflex microscopic    Skin change        Relevant Orders    Ambulatory referral to Dermatology        Gwendolyn Grant, MD

## 2014-06-23 NOTE — Assessment & Plan Note (Signed)
chronic symptoms - initially much improved with wellbutrin but self DC'd 10/2013 due to concern for ?hair loss side effects  Never began recommended prior Lyrica trial due to feared side effects  Also poorly tolerant of prior Lexapro and low dose TCA - nortrip Pt now ready to start SNRI Cymbalta - we reviewed potential risk/benefit and possible side effects - pt understands and agrees to same  Pt will send MyC msg in 4-8 wks with update on response

## 2014-06-23 NOTE — Patient Instructions (Addendum)
It was good to see you today.  We have reviewed your prior records including labs and tests today  Health Maintenance reviewed - all recommended immunizations and age-appropriate screenings are up-to-date.  We'll make referral to dermatologist as requested.  Test(s) ordered today. Your results will be released to [place] (or called to you) after review, usually within 72hours after test completion. If any changes need to be made, you will be notified at that same time.  Medications reviewed and updated Start low-dose Cymbalta 30 mg once daily for fibromyalgia, no other changes recommended at this time.  Your prescription(s) have been submitted to your pharmacy. Please take as directed and contact our office if you believe you are having problem(s) with the medication(s).  Please schedule followup in 6 months for semiannual exam and labs, call sooner if problems.

## 2014-06-23 NOTE — Assessment & Plan Note (Signed)
Reports remote "iodine" treatment for same >5 years ago - significant size R nodule on [place] eval by endo 01/2014 Emeterio Reeve) bx [id number]: benign follicular nodule Pt still interested in surgical excision due to size and cosmetic appearance - will let me know who at [facility] she would like to see for same

## 2014-07-18 ENCOUNTER — Other Ambulatory Visit (INDEPENDENT_AMBULATORY_CARE_PROVIDER_SITE_OTHER): Payer: 59

## 2014-07-18 DIAGNOSIS — Z0000 Encounter for general adult medical examination without abnormal findings: Secondary | ICD-10-CM

## 2014-07-18 LAB — URINALYSIS, ROUTINE W REFLEX MICROSCOPIC
Bilirubin Urine: NEGATIVE
Hgb urine dipstick: NEGATIVE
Ketones, ur: NEGATIVE
Leukocytes, UA: NEGATIVE
Nitrite: NEGATIVE
RBC / HPF: NONE SEEN (ref 0–?)
Specific Gravity, Urine: 1.015 (ref 1.000–1.030)
Urine Glucose: NEGATIVE
Urobilinogen, UA: 1 (ref 0.0–1.0)
pH: 8 (ref 5.0–8.0)

## 2014-07-18 LAB — BASIC METABOLIC PANEL
BUN: 15 mg/dL (ref 6–23)
CO2: 28 mEq/L (ref 19–32)
Calcium: 9.3 mg/dL (ref 8.4–10.5)
Chloride: 110 mEq/L (ref 96–112)
Creatinine, Ser: 1.21 mg/dL — ABNORMAL HIGH (ref 0.40–1.20)
GFR: 51.19 mL/min — ABNORMAL LOW (ref >60.00)
Glucose, Bld: 96 mg/dL (ref 70–99)
Potassium: 4.4 mEq/L (ref 3.5–5.1)
Sodium: 141 mEq/L (ref 135–145)

## 2014-07-18 LAB — CBC WITH DIFFERENTIAL/PLATELET
Basophils Absolute: 0 10*3/uL (ref 0.0–0.1)
Basophils Relative: 0.3 % (ref 0.0–3.0)
Eosinophils Absolute: 0.2 10*3/uL (ref 0.0–0.7)
Eosinophils Relative: 3.8 % (ref 0.0–5.0)
HCT: 41.5 % (ref 36.0–46.0)
Hemoglobin: 14.3 g/dL (ref 12.0–15.0)
Lymphocytes Relative: 29.8 % (ref 12.0–46.0)
Lymphs Abs: 2 10*3/uL (ref 0.7–4.0)
MCHC: 34.5 g/dL (ref 30.0–36.0)
MCV: 91.6 fl (ref 78.0–100.0)
Monocytes Absolute: 0.5 10*3/uL (ref 0.1–1.0)
Monocytes Relative: 8.1 % (ref 3.0–12.0)
Neutro Abs: 3.9 10*3/uL (ref 1.4–7.7)
Neutrophils Relative %: 58 % (ref 43.0–77.0)
Platelets: 199 10*3/uL (ref 150.0–400.0)
RBC: 4.53 Mil/uL (ref 3.87–5.11)
RDW: 13.1 % (ref 11.5–15.5)
WBC: 6.6 10*3/uL (ref 4.0–10.5)

## 2014-07-18 LAB — HEPATIC FUNCTION PANEL
ALT: 19 U/L (ref 0–35)
AST: 14 U/L (ref 0–37)
Albumin: 4 g/dL (ref 3.5–5.2)
Alkaline Phosphatase: 54 U/L (ref 39–117)
Bilirubin, Direct: 0.1 mg/dL (ref 0.0–0.3)
Total Bilirubin: 0.5 mg/dL (ref 0.2–1.2)
Total Protein: 6.4 g/dL (ref 6.0–8.3)

## 2014-07-18 LAB — LIPID PANEL
Cholesterol: 155 mg/dL (ref 0–200)
HDL: 44.3 mg/dL (ref >39.00)
LDL Cholesterol: 93 mg/dL (ref 0–99)
NonHDL: 110.7
Total CHOL/HDL Ratio: 3
Triglycerides: 91 mg/dL (ref 0.0–149.0)
VLDL: 18.2 mg/dL (ref 0.0–40.0)

## 2014-07-18 LAB — TSH: TSH: 0.77 u[IU]/mL (ref 0.35–4.50)

## 2014-07-21 ENCOUNTER — Encounter: Payer: Self-pay | Admitting: Internal Medicine

## 2014-08-21 ENCOUNTER — Other Ambulatory Visit: Payer: Self-pay

## 2014-08-21 MED ORDER — SUMATRIPTAN SUCCINATE 100 MG PO TABS: ORAL_TABLET | ORAL | Status: DC

## 2014-09-14 ENCOUNTER — Encounter: Payer: Self-pay | Admitting: Internal Medicine

## 2014-09-15 MED ORDER — DULOXETINE HCL 60 MG PO CPEP: 60.0000 mg | ORAL_CAPSULE | Freq: Every day | ORAL | Status: DC

## 2014-09-19 ENCOUNTER — Ambulatory Visit: Payer: 59 | Admitting: Family

## 2014-09-19 DIAGNOSIS — Z0289 Encounter for other administrative examinations: Secondary | ICD-10-CM

## 2014-09-22 ENCOUNTER — Telehealth: Payer: Self-pay | Admitting: Internal Medicine

## 2014-09-22 NOTE — Telephone Encounter (Signed)
Patient no showed for acute visit on 6/10.  Please advise.

## 2014-09-22 NOTE — Telephone Encounter (Signed)
May reschedule if she calls back.

## 2014-09-23 NOTE — Telephone Encounter (Signed)
Noted  

## 2014-10-09 ENCOUNTER — Encounter: Payer: Self-pay | Admitting: Internal Medicine

## 2014-10-10 ENCOUNTER — Ambulatory Visit (INDEPENDENT_AMBULATORY_CARE_PROVIDER_SITE_OTHER)
Admission: RE | Admit: 2014-10-10 | Discharge: 2014-10-10 | Disposition: A | Discharge status: Home / Self Care | Payer: 59 | Source: Ambulatory Visit | Attending: Internal Medicine | Admitting: Internal Medicine

## 2014-10-10 ENCOUNTER — Encounter: Payer: Self-pay | Admitting: Internal Medicine

## 2014-10-10 ENCOUNTER — Ambulatory Visit (INDEPENDENT_AMBULATORY_CARE_PROVIDER_SITE_OTHER): Payer: 59 | Admitting: Internal Medicine

## 2014-10-10 VITALS — BP 106/72 | HR 68 | Temp 98.2°F | Ht 63.0 in | Wt 201.0 lb

## 2014-10-10 DIAGNOSIS — F410 Panic disorder [episodic paroxysmal anxiety] without agoraphobia: Secondary | ICD-10-CM | POA: Diagnosis not present

## 2014-10-10 DIAGNOSIS — R079 Chest pain, unspecified: Secondary | ICD-10-CM | POA: Diagnosis not present

## 2014-10-10 DIAGNOSIS — F418 Other specified anxiety disorders: Secondary | ICD-10-CM | POA: Diagnosis not present

## 2014-10-10 MED ORDER — ALPRAZOLAM 0.25 MG PO TABS: ORAL_TABLET | ORAL | Status: DC

## 2014-10-10 NOTE — Patient Instructions (Signed)
Please take all new medication as prescribed - the xanax only if needed  Please continue all other medications as before, and refills have been done if requested.  Please have the pharmacy call with any other refills you may need.  Please keep your appointments with your specialists as you may have planned  Please go to the XRAY Department in the Basement (go straight as you get off the elevator) for the x-ray testing  You will be contacted by phone if any changes need to be made immediately.  Otherwise, you will receive a letter about your results with an explanation, but please check with MyChart first.  Please remember to sign up for MyChart if you have not done so, as this will be important to you in the future with finding out test results, communicating by private email, and scheduling acute appointments online when needed.

## 2014-10-10 NOTE — Assessment & Plan Note (Signed)
[place] for limited xanax prn, cont cymbalta 60, declines counseling referral,  to f/u any worsening symptoms or concerns

## 2014-10-10 NOTE — Assessment & Plan Note (Addendum)
ECG reviewed as per emr, suspect MSK, for cxr as well, very much doubt cardiac - to hold for now on stress testing,  to f/u any worsening symptoms or concerns

## 2014-10-10 NOTE — Progress Notes (Signed)
Pre visit review using our clinic review tool, if applicable. No additional management support is needed unless otherwise documented below in the visit note. 

## 2014-10-10 NOTE — Assessment & Plan Note (Signed)
To cont the cymbalta, f/u psychiatry as planned

## 2014-10-10 NOTE — Progress Notes (Signed)
Subjective:    Patient ID: [name], female    DOB: 03/06/1971, 44 y.o.   MRN: [id number]  HPI  Here to f/u;,  Pt denies increasing sob or doe, wheezing, orthopnea, PND, increased LE swelling, palpitations, or syncope.  Pt denies new neurological symptoms such as new headache, or facial or extremity weakness or numbness.  Pt denies polydipsia, polyuria, or low sugar episode.   Pt denies new neurological symptoms such as new headache, or facial or extremity weakness or numbness.   Pt states overall good compliance with meds, mostly trying to follow appropriate diet, with wt overall stable,  but little exercise however.  Taking care of mom and dad living in [place], father can be belligerant at times, gets her upset when he takes frustration out on her.  Tues with onset CP and tightness, nausea and dizzy, mild but recurring last few days.  Denies worsening reflux, abd pain, dysphagia, n/v, bowel change or blood. Denies worsening depressive symptoms, no suicidal ideation, or panic;   Past Medical History  Diagnosis Date  . History of chicken pox   . Depression   . History of migraine   . History of UTI   . Fibromyalgia   . Anxiety   . Endometriosis   . Fibroid   . Right thyroid nodule     endo eval summer 2015 - bx: benign follicular nodule   Past Surgical History  Procedure Laterality Date  . Tonsillectomy  1988  . Abdominal hysterectomy  2011  . Breast biopsy  2007    dense breast    reports that she has never smoked. She does not have any smokeless tobacco history on file. She reports that she does not drink alcohol or use illicit drugs. family history includes Arthritis in her father and mother; Breast cancer in her cousin; Diabetes in her father; Heart disease in her maternal grandmother; Hyperlipidemia in her father and mother; Hypertension in her brother, father, and mother; Mental illness in her mother; Prostate cancer in her paternal grandfather; Stroke in her maternal  grandmother and mother. Allergies  Allergen Reactions  . Erythromycin   . Keflex [Cephalexin]   . Penicillins   . Sulfa Antibiotics    Current Outpatient Prescriptions on File Prior to Visit  Medication Sig Dispense Refill  . atenolol (TENORMIN) 50 MG tablet Take 1 tablet (50 mg total) by mouth daily. 90 tablet 3  . cetirizine (ZYRTEC) 10 MG tablet Take 10 mg by mouth daily.    . DULoxetine (CYMBALTA) 60 MG capsule Take 1 capsule (60 mg total) by mouth daily. 90 capsule 3  . MELATONIN PO Take by mouth daily.    . SUMAtriptan (IMITREX) 100 MG tablet Take 0.5-1 tablet at the onset of headache. May repeat 2 hours later if needed. 9 tablet 0  . triamcinolone (NASACORT ALLERGY 24HR) 55 MCG/ACT AERO nasal inhaler Place 2 sprays into the nose daily.     No current facility-administered medications on file prior to visit.    Review of Systems  Constitutional: Negative for unusual diaphoresis or night sweats HENT: Negative for ringing in ear or discharge Eyes: Negative for double vision or worsening visual disturbance.  Respiratory: Negative for choking and stridor.   Gastrointestinal: Negative for vomiting or other signifcant bowel change Genitourinary: Negative for hematuria or change in urine volume.  Musculoskeletal: Negative for other MSK pain or swelling Skin: Negative for color change and worsening wound.  Neurological: Negative for tremors and numbness other than noted  Psychiatric/Behavioral: Negative for decreased concentration or agitation other than above       Objective:   Physical Exam BP 106/72 mmHg  Pulse 68  Temp(Src) 98.2 F (36.8 C) (Oral)  Ht 5\' 3"  (1.6 m)  Wt 201 lb (91.173 kg)  BMI 35.61 kg/m2  SpO2 96% VS noted, not ill appearing Constitutional: Pt appears in no significant distress HENT: Head: NCAT.  Right Ear: External ear normal.  Left Ear: External ear normal.  Eyes: . Pupils are equal, round, and reactive to light. Conjunctivae and EOM are  normal Neck: Normal range of motion. Neck supple.  Cardiovascular: Normal rate and regular rhythm.   Pulmonary/Chest: Effort normal and breath sounds without rales or wheezing.  Abd:  Soft, NT, ND, + BS Neurological: Pt is alert. Not confused , motor grossly intact Skin: Skin is warm. No rash, no LE edema Psychiatric: Pt behavior is normal. No agitation. 1-2+ nervous    Assessment & Plan:

## 2014-11-02 ENCOUNTER — Telehealth: Payer: 59 | Admitting: Family

## 2014-11-02 DIAGNOSIS — J0120 Acute ethmoidal sinusitis, unspecified: Secondary | ICD-10-CM

## 2014-11-02 MED ORDER — LEVOFLOXACIN 500 MG PO TABS: 500.0000 mg | ORAL_TABLET | Freq: Every day | ORAL | Status: DC

## 2014-11-02 NOTE — Progress Notes (Signed)
We are sorry that you are not feeling well.  Here is how we plan to help!  Based on what you have shared with me it looks like you have sinusitis.  Sinusitis is inflammation and infection in the sinus cavities of the head.  Based on your presentation I believe you most likely have Acute Bacterial sinusitis.  This is an infection caused by bacteria and is treated with antibiotics.  I have prescribed Levofloxicin 500mg  by mouth once daily for 7 days.. You may use an oral decongestant such as Mucinex D or if you have glaucoma or high blood pressure use plain Mucinex.  Saline nasal sprays help and can safely be used as often as needed for congestion. If you develop worsening sinus pain, fever or notice severe headache and vision changes, or if symptoms are not better after completion of antibiotic, please schedule an appointment with a health care provider.  Sinus infections are not as easily transmitted as other respiratory infection, however we still recommend that you avoid close contact with loved ones, especially the very young and elderly.  Remember to wash your hands thoroughly throughout the day as this is the number one way to prevent the spread of infection!  Home Care:  Only take medications as instructed by your medical team.  Complete the entire course of an antibiotic.  Do not take these medications with alcohol.  A steam or ultrasonic humidifier can help congestion.  You can place a towel over your head and breathe in the steam from hot water coming from a faucet.  Avoid close contacts especially the very young and the elderly.  Cover your mouth when you cough or sneeze.  Always remember to wash your hands.  Get Help Right Away If:  You develop worsening fever or sinus pain.  You develop a severe head ache or visual changes.  Your symptoms persist after you have completed your treatment plan.  Make sure you  Understand these instructions.  Will watch your  condition.  Will get help right away if you are not doing well or get worse.  Your e-visit answers were reviewed by a board certified advanced clinical practitioner to complete your personal care plan.  Depending on the condition, your plan could have included both over the counter or prescription medications.  If there is a problem please reply  once you have received a response from your provider.  Your safety is important to [place].  If you have drug allergies check your prescription carefully.    You can use MyChart to ask questions about today's visit, request a non-urgent call back, or ask for a work or school excuse.  You will get an e-mail in the next two days asking about your experience.  I hope that your e-visit has been valuable and will speed your recovery. Thank you for using e-visits.

## 2014-12-12 ENCOUNTER — Telehealth: Payer: Self-pay | Admitting: Internal Medicine

## 2014-12-12 NOTE — Telephone Encounter (Signed)
Pt called in and 12/6   Best number [phone]

## 2014-12-24 ENCOUNTER — Ambulatory Visit: Payer: 59 | Admitting: Internal Medicine

## 2015-03-11 ENCOUNTER — Encounter: Payer: Self-pay | Admitting: Family

## 2015-03-11 ENCOUNTER — Ambulatory Visit (INDEPENDENT_AMBULATORY_CARE_PROVIDER_SITE_OTHER): Payer: 59 | Admitting: Family

## 2015-03-11 ENCOUNTER — Other Ambulatory Visit: Payer: Self-pay

## 2015-03-11 VITALS — BP 130/88 | HR 70 | Temp 98.1°F | Resp 18 | Ht 63.0 in | Wt 210.8 lb

## 2015-03-11 DIAGNOSIS — Z111 Encounter for screening for respiratory tuberculosis: Secondary | ICD-10-CM | POA: Diagnosis not present

## 2015-03-11 DIAGNOSIS — Z23 Encounter for immunization: Secondary | ICD-10-CM

## 2015-03-11 DIAGNOSIS — Z021 Encounter for pre-employment examination: Secondary | ICD-10-CM | POA: Insufficient documentation

## 2015-03-11 MED ORDER — DULOXETINE HCL 60 MG PO CPEP: 60.0000 mg | ORAL_CAPSULE | Freq: Every day | ORAL | Status: DC

## 2015-03-11 NOTE — Addendum Note (Signed)
Addended by: Delice Bison E on: 03/11/2015 04:13 PM   Modules accepted: Orders

## 2015-03-11 NOTE — Assessment & Plan Note (Signed)
Normal pre-employment examination and is cleared to physically perform her job with [employer]. Influenza and tetanus updated today. TB skin test placed. Form completed and will be signed pending TB skin test results.

## 2015-03-11 NOTE — Progress Notes (Signed)
Pre visit review using our clinic review tool, if applicable. No additional management support is needed unless otherwise documented below in the visit note. 

## 2015-03-11 NOTE — Progress Notes (Signed)
Subjective:    Patient ID: [name], female    DOB: [birth year], 44 y.o.   MRN: [id number]  Chief Complaint  Patient presents with  . CPE    Form filled out for work, immunizations    HPI:  [name] is a 44 y.o. female who  has a past medical history of History of chicken pox; Depression; History of migraine; History of UTI; Fibromyalgia; Anxiety; Endometriosis; Fibroid; and Right thyroid nodule. and presents today for an office visit.  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  Allergies  Allergen Reactions  . Erythromycin   . Keflex [Cephalexin]   . Penicillins   . Sulfa Antibiotics      Outpatient Prescriptions Prior to Visit  Medication Sig Dispense Refill  . ALPRAZolam (XANAX) 0.25 MG tablet 1/2 - 2 tabs by mouth twice per day as needed 60 tablet 0  . atenolol (TENORMIN) 50 MG tablet Take 1 tablet (50 mg total) by mouth daily. 90 tablet 3  . cetirizine (ZYRTEC) 10 MG tablet Take 10 mg by mouth daily.    . DULoxetine (CYMBALTA) 60 MG capsule Take 1 capsule (60 mg total) by mouth daily. Must establish with NEW PCP for additional refills. 90 capsule 0  . levofloxacin (LEVAQUIN) 500 MG tablet Take 1 tablet (500 mg total) by mouth daily. 7 tablet 0  . MELATONIN PO Take by mouth daily.    . SUMAtriptan (IMITREX) 100 MG tablet Take 0.5-1 tablet at the onset of headache. May repeat 2 hours later if needed. 9 tablet 0  . triamcinolone (NASACORT ALLERGY 24HR) 55 MCG/ACT AERO nasal inhaler Place 2 sprays into the nose daily.    . DULoxetine (CYMBALTA) 60 MG capsule Take 1 capsule (60 mg total) by mouth daily. 90 capsule 3   No facility-administered medications prior to visit.     Past Medical History  Diagnosis Date  . History of chicken pox   . Depression   . History of migraine   . History of UTI   . Fibromyalgia   . Anxiety   . Endometriosis   . Fibroid   . Right thyroid nodule     endo eval  summer 2015 - bx: benign follicular nodule     Past Surgical History  Procedure Laterality Date  . Tonsillectomy  1988  . Abdominal hysterectomy  2011  . Breast biopsy  2007    dense breast     Family History  Problem Relation Age of Onset  . Arthritis Mother   . Hyperlipidemia Mother   . Hypertension Mother   . Stroke Mother   . Mental illness Mother   . Arthritis Father   . Hyperlipidemia Father   . Hypertension Father   . Diabetes Father   . Heart disease Maternal Grandmother   . Stroke Maternal Grandmother   . Prostate cancer Paternal Grandfather   . Breast cancer Cousin   . Hypertension Brother      Social History   Social History  . Marital Status: Married    Spouse Name: N/A  . Number of Children: N/A  . Years of Education: N/A   Occupational History  . Not on file.   Social History Main Topics  . Smoking status: Never Smoker   . Smokeless tobacco: Not on file  . Alcohol Use: No  . Drug Use: No  . Sexual Activity:    Partners: Male  Comment: Hyst   Other Topics Concern  . Not on file   Social History Narrative    Review of Systems    Constitutional: Denies fever, chills, fatigue, or significant weight gain/loss. HENT: Head: Denies headache or neck pain Ears: Denies changes in hearing, ringing in ears, earache, drainage Nose: Denies discharge, stuffiness, itching, nosebleed, sinus pain Throat: Denies sore throat, hoarseness, dry mouth, sores, thrush Eyes: Denies loss/changes in vision, pain, redness, blurry/double vision, flashing lights Cardiovascular: Denies chest pain/discomfort, tightness, palpitations, shortness of breath with activity, difficulty lying down, swelling, sudden awakening with shortness of breath Respiratory: Denies shortness of breath, cough, sputum production, wheezing Gastrointestinal: Denies dysphasia, heartburn, change in appetite, nausea, change in bowel habits, rectal bleeding, constipation, diarrhea, yellow skin  or eyes Genitourinary: Denies frequency, urgency, burning/pain, blood in urine, incontinence, change in urinary strength. Musculoskeletal: Denies muscle/joint pain, stiffness, back pain, redness or swelling of joints, trauma Positive for fibromyalgia Skin: Denies rashes, lumps, itching, dryness, color changes, or hair/nail changes Neurological: Denies dizziness, fainting, seizures, weakness, numbness, tingling, tremor Psychiatric - Denies nervousness, stress, depression or memory loss Endocrine: Denies heat or cold intolerance, sweating, frequent urination, excessive thirst, changes in appetite Hematologic: Denies ease of bruising or bleeding  Objective:    BP 130/88 mmHg  Pulse 70  Temp(Src) 98.1 F (36.7 C) (Oral)  Resp 18  Ht 5\' 3"  (1.6 m)  Wt 210 lb 12.8 oz (95.618 kg)  BMI 37.35 kg/m2  SpO2 96% Nursing note and vital signs reviewed.  Physical Exam  Constitutional: She is oriented to person, place, and time. She appears well-developed and well-nourished.  HENT:  Head: Normocephalic.  Right Ear: Hearing, tympanic membrane, external ear and ear canal normal.  Left Ear: Hearing, tympanic membrane, external ear and ear canal normal.  Nose: Nose normal.  Mouth/Throat: Uvula is midline, oropharynx is clear and moist and mucous membranes are normal.  Eyes: Conjunctivae and EOM are normal. Pupils are equal, round, and reactive to light.  Neck: Neck supple. No JVD present. No tracheal deviation present. No thyromegaly present.  Cardiovascular: Normal rate, regular rhythm, normal heart sounds and intact distal pulses.   Pulmonary/Chest: Effort normal and breath sounds normal.  Abdominal: Soft. Bowel sounds are normal. She exhibits no distension and no mass. There is no tenderness. There is no rebound and no guarding.  Musculoskeletal: Normal range of motion. She exhibits no edema or tenderness.  Lymphadenopathy:    She has no cervical adenopathy.  Neurological: She is alert and  oriented to person, place, and time. She has normal reflexes. No cranial nerve deficit. She exhibits normal muscle tone. Coordination normal.  Skin: Skin is warm and dry.  Psychiatric: She has a normal mood and affect. Her behavior is normal. Judgment and thought content normal.       Assessment & Plan:   Problem List Items Addressed This Visit      Other   Pre-employment examination - Primary    Normal pre-employment examination and is cleared to physically perform her job with [employer]. Influenza and tetanus updated today. TB skin test placed. Form completed and will be signed pending TB skin test results.

## 2015-03-11 NOTE — Patient Instructions (Signed)
Thank you for choosing [employer].  Summary/Instructions:  Good luck with your new job!  Please follow up on Friday for your TB test.

## 2015-03-17 ENCOUNTER — Ambulatory Visit: Payer: 59 | Admitting: Internal Medicine

## 2015-04-15 ENCOUNTER — Ambulatory Visit (INDEPENDENT_AMBULATORY_CARE_PROVIDER_SITE_OTHER): Payer: BC Managed Care – PPO

## 2015-04-15 DIAGNOSIS — Z111 Encounter for screening for respiratory tuberculosis: Secondary | ICD-10-CM | POA: Diagnosis not present

## 2015-04-17 LAB — TB SKIN TEST
Induration: 0 mm
TB Skin Test: NEGATIVE

## 2015-04-25 ENCOUNTER — Telehealth: Payer: Self-pay | Admitting: Physician Assistant

## 2015-04-25 DIAGNOSIS — B9689 Other specified bacterial agents as the cause of diseases classified elsewhere: Secondary | ICD-10-CM

## 2015-04-25 DIAGNOSIS — J019 Acute sinusitis, unspecified: Secondary | ICD-10-CM

## 2015-04-25 MED ORDER — LEVOFLOXACIN 500 MG PO TABS: 500.0000 mg | ORAL_TABLET | Freq: Every day | ORAL | Status: DC

## 2015-04-25 NOTE — Progress Notes (Signed)

## 2015-05-05 ENCOUNTER — Other Ambulatory Visit: Payer: Self-pay

## 2015-05-05 DIAGNOSIS — Z1231 Encounter for screening mammogram for malignant neoplasm of breast: Secondary | ICD-10-CM

## 2015-05-14 ENCOUNTER — Ambulatory Visit: Payer: BC Managed Care – PPO

## 2015-05-22 ENCOUNTER — Ambulatory Visit (INDEPENDENT_AMBULATORY_CARE_PROVIDER_SITE_OTHER): Payer: BC Managed Care – PPO | Admitting: Internal Medicine

## 2015-05-22 ENCOUNTER — Encounter: Payer: Self-pay | Admitting: Internal Medicine

## 2015-05-22 ENCOUNTER — Other Ambulatory Visit (INDEPENDENT_AMBULATORY_CARE_PROVIDER_SITE_OTHER): Payer: BC Managed Care – PPO

## 2015-05-22 VITALS — BP 136/86 | HR 60 | Temp 98.3°F | Resp 14 | Ht 63.0 in | Wt 215.0 lb

## 2015-05-22 DIAGNOSIS — E041 Nontoxic single thyroid nodule: Secondary | ICD-10-CM

## 2015-05-22 DIAGNOSIS — R7989 Other specified abnormal findings of blood chemistry: Secondary | ICD-10-CM

## 2015-05-22 DIAGNOSIS — Z0000 Encounter for general adult medical examination without abnormal findings: Secondary | ICD-10-CM | POA: Diagnosis not present

## 2015-05-22 DIAGNOSIS — J329 Chronic sinusitis, unspecified: Secondary | ICD-10-CM

## 2015-05-22 DIAGNOSIS — J321 Chronic frontal sinusitis: Secondary | ICD-10-CM | POA: Diagnosis not present

## 2015-05-22 LAB — LIPID PANEL
Cholesterol: 138 mg/dL (ref 0–200)
HDL: 36.7 mg/dL — ABNORMAL LOW (ref >39.00)
NonHDL: 101.42
Total CHOL/HDL Ratio: 4
Triglycerides: 236 mg/dL — ABNORMAL HIGH (ref 0.0–149.0)
VLDL: 47.2 mg/dL — ABNORMAL HIGH (ref 0.0–40.0)

## 2015-05-22 LAB — COMPREHENSIVE METABOLIC PANEL
ALT: 22 U/L (ref 0–35)
AST: 15 U/L (ref 0–37)
Albumin: 4.1 g/dL (ref 3.5–5.2)
Alkaline Phosphatase: 66 U/L (ref 39–117)
BUN: 14 mg/dL (ref 6–23)
CO2: 32 mEq/L (ref 19–32)
Calcium: 9.5 mg/dL (ref 8.4–10.5)
Chloride: 102 mEq/L (ref 96–112)
Creatinine, Ser: 0.89 mg/dL (ref 0.40–1.20)
GFR: 72.69 mL/min (ref >60.00)
Glucose, Bld: 109 mg/dL — ABNORMAL HIGH (ref 70–99)
Potassium: 3.6 mEq/L (ref 3.5–5.1)
Sodium: 140 mEq/L (ref 135–145)
Total Bilirubin: 0.2 mg/dL (ref 0.2–1.2)
Total Protein: 6.9 g/dL (ref 6.0–8.3)

## 2015-05-22 LAB — T4, FREE: Free T4: 0.78 ng/dL (ref 0.60–1.60)

## 2015-05-22 LAB — LDL CHOLESTEROL, DIRECT: Direct LDL: 73 mg/dL

## 2015-05-22 LAB — TSH: TSH: 0.81 u[IU]/mL (ref 0.35–4.50)

## 2015-05-22 LAB — HEMOGLOBIN A1C: Hgb A1c MFr Bld: 5.8 % (ref 4.6–6.5)

## 2015-05-22 MED ORDER — ALPRAZOLAM 0.25 MG PO TABS: 0.2500 mg | ORAL_TABLET | Freq: Every day | ORAL | Status: AC | PRN

## 2015-05-22 MED ORDER — LEVOFLOXACIN 500 MG PO TABS: 500.0000 mg | ORAL_TABLET | Freq: Every day | ORAL | Status: DC

## 2015-05-22 MED ORDER — DULOXETINE HCL 60 MG PO CPEP: 60.0000 mg | ORAL_CAPSULE | Freq: Every day | ORAL | Status: DC

## 2015-05-22 MED ORDER — BUTALBITAL-APAP-CAFFEINE 50-325-40 MG PO TABS: 1.0000 | ORAL_TABLET | Freq: Four times a day (QID) | ORAL | Status: AC | PRN

## 2015-05-22 NOTE — Patient Instructions (Signed)
We have given you the prescription for the xanax and the headache medicine. That is called fiorcet and is 1-2 pills for headache to help with the pain.   We have sent in levaquin for the sinuses to take 1 pill daily for 2 weeks.   We have sent in the refill of the cymbalta and will get you in with ENT.

## 2015-05-22 NOTE — Progress Notes (Signed)
Pre visit review using our clinic review tool, if applicable. No additional management support is needed unless otherwise documented below in the visit note. 

## 2015-05-24 DIAGNOSIS — J329 Chronic sinusitis, unspecified: Secondary | ICD-10-CM | POA: Insufficient documentation

## 2015-05-24 NOTE — Progress Notes (Signed)
   Subjective:    Patient ID: [name], female    DOB: 1971-01-21, 45 y.o.   MRN: [id number]  HPI The patient is a 45 YO female coming in for chronic sinusitis. She is taking nasacort and given antibiotics for 5 days recently (levaquin). She does have allergies to multiple antibiotics including doxycycline (not in her allergy list but added during visit). She is having pressure in her sinuses for the last 3 weeks. Minimal improvement with the antibiotics. She is using nasal rinses as well. Seems like one side is always clogged. Taking zyrtec as well. Has not had allergy testing in the past.   Review of Systems  Constitutional: Positive for activity change and appetite change. Negative for fever, chills, fatigue and unexpected weight change.  HENT: Positive for congestion, ear pain, postnasal drip, rhinorrhea and sinus pressure. Negative for ear discharge, sore throat and trouble swallowing.   Eyes: Negative.   Respiratory: Positive for cough. Negative for chest tightness, shortness of breath and wheezing.   Cardiovascular: Negative for chest pain, palpitations and leg swelling.  Gastrointestinal: Negative.   Musculoskeletal: Negative.   Skin: Negative.   Neurological: Negative.   Psychiatric/Behavioral: Negative.       Objective:   Physical Exam  Constitutional: She is oriented to person, place, and time. She appears well-developed and well-nourished.  HENT:  Head: Normocephalic and atraumatic.  TMs normal bilaterally, sinus pressure in the frontal sinuses, oropharynx with drainage and nasal crusting yellow bilaterally.  Eyes: EOM are normal.  Neck: Normal range of motion.  Cardiovascular: Normal rate and regular rhythm.   Pulmonary/Chest: Effort normal and breath sounds normal. No respiratory distress. She has no wheezes. She has no rales.  Abdominal: Soft. Bowel sounds are normal.  Musculoskeletal: She exhibits no edema.  Lymphadenopathy:    She has no cervical adenopathy.    Neurological: She is alert and oriented to person, place, and time. Coordination normal.  Skin: Skin is warm and dry.   Filed Vitals:   05/22/15 1616  BP: 136/86  Pulse: 60  Temp: 98.3 F (36.8 C)  TempSrc: Oral  Resp: 14  Height: 5\' 3"  (1.6 m)  Weight: 215 lb (97.523 kg)  SpO2: 98%      Assessment & Plan:

## 2015-05-24 NOTE — Assessment & Plan Note (Signed)
Rx for longer course of levaquin although we talked in the office that this is not an ideal choice. She feels unable to take other medications due to allergy. She is taking nasacort and zyrtec and will continue nasal rinses. Referral to ENT.

## 2015-06-19 ENCOUNTER — Ambulatory Visit: Payer: BC Managed Care – PPO | Admitting: Internal Medicine

## 2015-06-19 ENCOUNTER — Other Ambulatory Visit: Payer: Self-pay | Admitting: Internal Medicine

## 2015-06-22 ENCOUNTER — Telehealth: Payer: Self-pay | Admitting: Family Medicine

## 2015-06-22 MED ORDER — OSELTAMIVIR PHOSPHATE 75 MG PO CAPS: 75.0000 mg | ORAL_CAPSULE | Freq: Every day | ORAL | Status: DC

## 2015-06-22 NOTE — Telephone Encounter (Signed)
Patient's son in office with fever 101, body aches and flu exposure. Flu negative but pretest probability was high and false negative possible- mother wants prophylaxis and will send in

## 2015-06-24 ENCOUNTER — Encounter: Payer: Self-pay | Admitting: Family Medicine

## 2015-06-24 ENCOUNTER — Ambulatory Visit (INDEPENDENT_AMBULATORY_CARE_PROVIDER_SITE_OTHER): Payer: BC Managed Care – PPO | Admitting: Family Medicine

## 2015-06-24 VITALS — BP 122/86 | HR 72 | Temp 97.9°F | Resp 20 | Wt 210.5 lb

## 2015-06-24 DIAGNOSIS — M5416 Radiculopathy, lumbar region: Secondary | ICD-10-CM | POA: Insufficient documentation

## 2015-06-24 DIAGNOSIS — M5440 Lumbago with sciatica, unspecified side: Secondary | ICD-10-CM | POA: Diagnosis not present

## 2015-06-24 DIAGNOSIS — M545 Low back pain, unspecified: Secondary | ICD-10-CM | POA: Insufficient documentation

## 2015-06-24 MED ORDER — TRAMADOL HCL 50 MG PO TABS: 50.0000 mg | ORAL_TABLET | Freq: Three times a day (TID) | ORAL | Status: DC | PRN

## 2015-06-24 MED ORDER — PREDNISONE 20 MG PO TABS: ORAL_TABLET | ORAL | Status: DC

## 2015-06-24 MED ORDER — CYCLOBENZAPRINE HCL 10 MG PO TABS: 10.0000 mg | ORAL_TABLET | Freq: Three times a day (TID) | ORAL | Status: DC | PRN

## 2015-06-24 MED ORDER — METHYLPREDNISOLONE ACETATE 80 MG/ML IJ SUSP
80.0000 mg | Freq: Once | INTRAMUSCULAR | Status: AC
  Administered 2015-06-24: 80 mg via INTRAMUSCULAR

## 2015-06-24 NOTE — Patient Instructions (Signed)

## 2015-06-24 NOTE — Progress Notes (Signed)
Patient ID: [name], female   DOB: [birth year], 45 y.o.   MRN: [id number]    [name] , [birth year], 45 y.o., female MRN: [id number]  CC: Back pain  Subjective: Pt presents for an acute OV with complaints of low back of 5 days duration. She reports she  Stayed with her mother at the hospitia last Friday and slept in one of the reclining chairs available. She felt there was a bar across her lumbar spine. Her back was sore the next day, but by that evening it was "severe" pain. Associated symptoms include radiation of pain to bilateral lateral hips and anterior thigh. Pt feels symptoms are worse with sitting,laying flat, transitioning between positions. She denies prior back injury or surgery. Pt has tried heat, aleve, biofreeze and deep blue without great relief. She feels her lower back is spasm and occasional feels a "catch".  She denies bowel or bladder difficulties. No personal history of arthritis, FHX present.   Allergies  Allergen Reactions  . Doxycycline   . Erythromycin   . Keflex [Cephalexin]   . Penicillins   . Sulfa Antibiotics    Social History  Substance Use Topics  . Smoking status: Never Smoker   . Smokeless tobacco: Not on file  . Alcohol Use: No   Past Medical History  Diagnosis Date  . History of chicken pox   . Depression   . History of migraine   . History of UTI   . Fibromyalgia   . Anxiety   . Endometriosis   . Fibroid   . Right thyroid nodule     endo eval summer 2015 - bx: benign follicular nodule   Past Surgical History  Procedure Laterality Date  . Tonsillectomy  1988  . Abdominal hysterectomy  2011  . Breast biopsy  2007    dense breast   Family History  Problem Relation Age of Onset  . Arthritis Mother   . Hyperlipidemia Mother   . Hypertension Mother   . Stroke Mother   . Mental illness Mother   . Arthritis Father   . Hyperlipidemia Father   . Hypertension Father   . Diabetes Father   . Heart disease Maternal Grandmother   . Stroke  Maternal Grandmother   . Prostate cancer Paternal Grandfather   . Breast cancer Cousin   . Hypertension Brother      Medication List       This list is accurate as of: 06/24/15  3:26 PM.  Always use your most recent med list.               ALPRAZolam 0.25 MG tablet  Commonly known as:  XANAX  Take 1 tablet (0.25 mg total) by mouth daily as needed for anxiety. 1/2 - 2 tabs by mouth twice per day as needed     atenolol 50 MG tablet  Commonly known as:  TENORMIN  TAKE ONE TABLET BY MOUTH ONE TIME DAILY     butalbital-acetaminophen-caffeine 50-325-40 MG tablet  Commonly known as:  FIORICET  Take 1-2 tablets by mouth every 6 (six) hours as needed for headache.     cetirizine 10 MG tablet  Commonly known as:  ZYRTEC  Take 10 mg by mouth daily.     DULoxetine 60 MG capsule  Commonly known as:  CYMBALTA  Take 1 capsule (60 mg total) by mouth daily.     NASACORT ALLERGY 24HR 55 MCG/ACT Aero nasal inhaler  Generic drug:  triamcinolone  Place 2 sprays into  the nose daily.     oseltamivir 75 MG capsule  Commonly known as:  TAMIFLU  Take 1 capsule (75 mg total) by mouth daily. For prophylaxis       ROS: Negative, with the exception of above mentioned in HPI  Objective:  BP 122/86 mmHg  Pulse 72  Temp(Src) 97.9 F (36.6 C)  Resp 20  Wt 210 lb 8 oz (95.482 kg)  SpO2 95% Body mass index is 37.3 kg/(m^2). Gen: Afebrile. No acute distress. Nontoxic in appearance. Well developed, well nourished, obese female. Pacing in  Room. Pleasant.  HENT: AT. [place].  MMM, no oral lesions.  Eyes:Pupils Equal Round Reactive to light, Extraocular movements intact,  Conjunctiva without redness, discharge or icterus. MSK:. No bruising or erythema. No bone tenderness. Mild rt. paraspinal soft tissue asymmetry and tenderness. TTP RT SI joint. Moderate discomfort RT SB and rotation, discomfort in flexion and extension. NEG SLR bilateral. POS rt FABRE for SI pain. Neg left FABRE.  Neuro: Guarded slow  gait. PERLA. EOMi. Alert. Oriented x3.  Muscle strength 5/5 bilateral lower  extremity. DTRs equal bilaterally.   Assessment/Plan: [name] is a 45 y.o. female present for acute OV for  1. Midline low back pain with sciatica, sciatica laterality unspecified - No injury, with the exception of sleeping in the recliner.  -  REST, no lifting.  - IM depo medrol, start prednisone taper in 24-48 hours with food. Tramadol for pain. Heat therapy (discussed proper use).   - predniSONE (DELTASONE) 20 MG tablet; 60 mg x3d, 40 mg x3d, 20 mg x2d, 10 mg x2d  Dispense: 18 tablet; Refill: 0 - traMADol (ULTRAM) 50 MG tablet; Take 1 tablet (50 mg total) by mouth every 8 (eight) hours as needed.  Dispense: 60 tablet; Refill: 0 - cyclobenzaprine (FLEXERIL) 10 MG tablet; Take 1 tablet (10 mg total) by mouth 3 (three) times daily as needed for muscle spasms.  Dispense: 30 tablet; Refill: 0 - methylPREDNISolone acetate (DEPO-MEDROL) injection 80 mg; Inject 1 mL (80 mg total) into the muscle once. - F/U 2-3 weeks if not resolved, would consider image at that time.   electronically signed by:  Howard Pouch, DO  [place]

## 2015-06-29 ENCOUNTER — Ambulatory Visit: Payer: BC Managed Care – PPO | Admitting: Internal Medicine

## 2015-07-20 ENCOUNTER — Ambulatory Visit: Payer: BC Managed Care – PPO | Admitting: Family Medicine

## 2015-07-22 ENCOUNTER — Ambulatory Visit (INDEPENDENT_AMBULATORY_CARE_PROVIDER_SITE_OTHER): Payer: BC Managed Care – PPO | Admitting: Family Medicine

## 2015-07-22 ENCOUNTER — Ambulatory Visit (INDEPENDENT_AMBULATORY_CARE_PROVIDER_SITE_OTHER)
Admission: RE | Admit: 2015-07-22 | Discharge: 2015-07-22 | Disposition: A | Discharge status: Home / Self Care | Payer: BC Managed Care – PPO | Source: Ambulatory Visit | Attending: Family Medicine | Admitting: Family Medicine

## 2015-07-22 ENCOUNTER — Encounter: Payer: Self-pay | Admitting: Family Medicine

## 2015-07-22 DIAGNOSIS — M5440 Lumbago with sciatica, unspecified side: Secondary | ICD-10-CM

## 2015-07-22 MED ORDER — BACLOFEN 10 MG PO TABS: 10.0000 mg | ORAL_TABLET | Freq: Two times a day (BID) | ORAL | Status: DC | PRN

## 2015-07-22 NOTE — Progress Notes (Signed)
Patient ID: [name], female   DOB: October 02, 1970, 45 y.o.   MRN: [id number]    [name] , Jan 17, 1971, 45 y.o., female MRN: [id number]  CC: back pain Subjective: Pt presents for follow up to back pain. She was seen 1 month ago after experiencing low back pain, after sleeping in a recliner at the hospital (see prior note below for details). She was given IM depo medrol shot, steroid taper, tramadol and flexeril. She reports she is only take 1/2 pills of tramadol because medications make her sleepy. She has continued the heat therapy and flexeril before bed. Flexeril, even 1/2 pills make her tired as well. She reports initially she felt better after being seen. However pain has started to return to prior level now. Pain is mid-back/lumbar area and radiates to bilateral hips, although left is worse than right. She also points to the LEFT SI joint as main area of discomfort. Mopping, laying on left side and driving makes pain worse. Laying flat and medications improve pain.  Pt has not had prior back injury or imaging of back.  06/24/2015 prior note: Pt presents for an acute OV with complaints of low back of 5 days duration. She reports she Stayed with her mother at the hospitia last Friday and slept in one of the reclining chairs available. She felt there was a bar across her lumbar spine. Her back was sore the next day, but by that evening it was "severe" pain. Associated symptoms include radiation of pain to bilateral lateral hips and anterior thigh. Pt feels symptoms are worse with sitting,laying flat, transitioning between positions. She denies prior back injury or surgery. Pt has tried heat, aleve, biofreeze and deep blue without great relief. She feels her lower back is spasm and occasional feels a "catch". She denies bowel or bladder difficulties. No personal history of arthritis, FHX present.   Allergies  Allergen Reactions  . Doxycycline   . Erythromycin   . Keflex [Cephalexin]   . Penicillins    . Sulfa Antibiotics    Social History  Substance Use Topics  . Smoking status: Never Smoker   . Smokeless tobacco: Not on file  . Alcohol Use: No   Past Medical History  Diagnosis Date  . History of chicken pox   . Depression   . History of migraine   . History of UTI   . Fibromyalgia   . Anxiety   . Endometriosis   . Fibroid   . Right thyroid nodule     endo eval summer 2015 - bx: benign follicular nodule   Past Surgical History  Procedure Laterality Date  . Tonsillectomy  1988  . Abdominal hysterectomy  2011  . Breast biopsy  2007    dense breast   Family History  Problem Relation Age of Onset  . Arthritis Mother   . Hyperlipidemia Mother   . Hypertension Mother   . Stroke Mother   . Mental illness Mother   . Arthritis Father   . Hyperlipidemia Father   . Hypertension Father   . Diabetes Father   . Heart disease Maternal Grandmother   . Stroke Maternal Grandmother   . Prostate cancer Paternal Grandfather   . Breast cancer Cousin   . Hypertension Brother      Medication List       This list is accurate as of: 07/22/15 10:54 AM.  Always use your most recent med list.  ALPRAZolam 0.25 MG tablet  Commonly known as:  XANAX  Take 1 tablet (0.25 mg total) by mouth daily as needed for anxiety. 1/2 - 2 tabs by mouth twice per day as needed     atenolol 50 MG tablet  Commonly known as:  TENORMIN  TAKE ONE TABLET BY MOUTH ONE TIME DAILY     butalbital-acetaminophen-caffeine 50-325-40 MG tablet  Commonly known as:  FIORICET  Take 1-2 tablets by mouth every 6 (six) hours as needed for headache.     cetirizine 10 MG tablet  Commonly known as:  ZYRTEC  Take 10 mg by mouth daily.     cyclobenzaprine 10 MG tablet  Commonly known as:  FLEXERIL  Take 1 tablet (10 mg total) by mouth 3 (three) times daily as needed for muscle spasms.     DULoxetine 60 MG capsule  Commonly known as:  CYMBALTA  Take 1 capsule (60 mg total) by mouth daily.      NASACORT ALLERGY 24HR 55 MCG/ACT Aero nasal inhaler  Generic drug:  triamcinolone  Place 2 sprays into the nose daily.     traMADol 50 MG tablet  Commonly known as:  ULTRAM  Take 1 tablet (50 mg total) by mouth every 8 (eight) hours as needed.       ROS: Negative, with the exception of above mentioned in HPI  Objective:  BP 118/83 mmHg  Pulse 74  Temp(Src) 98.4 F (36.9 C)  Resp 20  Wt 212 lb 8 oz (96.389 kg)  SpO2 94% Body mass index is 37.65 kg/(m^2). Gen: Afebrile. No acute distress. Non-toxic. Obese, pleasant female, appears uncomfortable sitting.  HENT: AT. [place].MMM, no oral lesions.  Eyes:Pupils Equal Round Reactive to light, Extraocular movements intact,  Conjunctiva without redness, discharge or icterus. MSK: No bruising or erythema. No bone tenderness. No muscle tenderness. TTP LEFT SI joint today. Tightness only to LT SB and rotation, discomfort in flexion, extension "feels good'. NEG SLR bilateral. POS FABRE bilateral  for SI pain.  Neuro: Slow, but Normal gait. PERLA. EOMi. Alert. Oriented x3 Cranial nerves intact. Muscle strength 5/5 bilateral LE extremity. DTRs equal bilaterally.  Assessment/Plan: [name] is a 45 y.o. female present for acute OV for  1. Midline low back pain with sciatica, sciatica laterality unspecified - Continue flexeril, tramadol, heat to area. - If pain is not improved or worsens will need to refer  and get MRI. - Once starting to improve may need to refer to PT - baclofen (LIORESAL) 10 MG tablet; Take 1 tablet (10 mg total) by mouth 2 (two) times daily as needed for muscle spasms.  Dispense: 60 each; Refill: 1 - DG Lumbar Spine Complete; Future - F/U dependent on xray and response to continued NSAID/BAclofen therapy.   > 25 minutes spent with patient, >50% of time spent face to face counseling patient and coordinating care.  electronically signed by:  Howard Pouch, DO  [place]

## 2015-07-22 NOTE — Patient Instructions (Signed)
Xray today. We will call you with results and plan.  Baclofen relaxer for daytime use.  Continue flexeril, tramadol, heat to area. If pain is not improved or worsens will need to refer you to specialist and get MRI. Once starting to improve may need to refer to PT

## 2015-07-23 ENCOUNTER — Telehealth: Payer: Self-pay | Admitting: Family Medicine

## 2015-07-23 DIAGNOSIS — M545 Low back pain, unspecified: Secondary | ICD-10-CM

## 2015-07-23 NOTE — Telephone Encounter (Signed)
Please call [name]: - her xray did not show any abnormality. I would like her to start physical therapy after another week. I have placed this referral. They will start slow and use pain as her guide. F/U 3-4 weeks.

## 2015-07-23 NOTE — Telephone Encounter (Signed)
Patient prefers Physical Therapy be scheduled at [facility] in [place]. If you need further information please call patient.

## 2015-07-31 NOTE — Telephone Encounter (Signed)
We find was going on with this referral? It is still open in my box, not certain if it has been missed her scheduled.

## 2015-07-31 NOTE — Telephone Encounter (Signed)
Referral has been sent they are trying to contact patient.

## 2015-09-04 ENCOUNTER — Ambulatory Visit (INDEPENDENT_AMBULATORY_CARE_PROVIDER_SITE_OTHER): Payer: BC Managed Care – PPO | Admitting: Internal Medicine

## 2015-09-04 ENCOUNTER — Encounter: Payer: Self-pay | Admitting: Internal Medicine

## 2015-09-04 VITALS — BP 122/74 | HR 59 | Temp 97.1°F | Resp 12 | Wt 220.0 lb

## 2015-09-04 DIAGNOSIS — E042 Nontoxic multinodular goiter: Secondary | ICD-10-CM | POA: Diagnosis not present

## 2015-09-04 NOTE — Patient Instructions (Signed)
We will schedule a new thyroid U/S for you.  Let me know the name of the surgeon at [facility].  Please return in 1 year.

## 2015-09-04 NOTE — Progress Notes (Signed)
Patient ID: [name], female   DOB: 1970/09/16, 45 y.o.   MRN: [id number]   HPI  [name] is a 45 y.o.-year-old female,returning for f/u for MNG.  I initially saw pt for hair loss - which started ~2000, it became worse when on Wellbutrin. Workup for endocrine ds'es was negative:  Component     Latest Ref Rng 01/09/2014  DHEA-SO4     19 - 231 ug/dL 83  Testosterone     10 - 70 ng/dL 39   Component     Latest Ref Rng 01/10/2014  Ferritin     10.0 - 291.0 ng/mL 67.4  Vitamin B-12     211 - 911 pg/mL 349  Methylmalonic Acid, Quant     87 - 318 nmol/L 97  Thyroid Peroxidase Antibody     <9 IU/mL <1  Thyroglobulin Ab     <2 IU/mL <1  Zinc     60 - 130 mcg/dL 79   MNG: Thyroid U/S (01/08/2014):  Right thyroid lobe: 5.3 x 2.2 x 1.9 cm. Cystic/minimally solid nodule measures 1.3 x 1.0 x 0.6 cm at the mid right thyroid.  Right mid thyroid lobe nodule, solid, 1.2 x 0.9 x 0.7 cm.  Right inferior thyroid lobe, predominantly cystic, 1.0 x 1.0 x 0.8 cm  Left thyroid lobe: 5.7 x 2.1 x 1.9 cm. Upper pole solid nodule measures 2.3 x 1.5 x 1.4 cm   Isthmus Thickness: 0.9 cm. Solid nodule measures 4.4 x 3.3 x 2.0 cm   Lymphadenopathy None visualized.  The 2 dominant nodules were biopsied in 01/21/2014 with benign results.  Pt feels that her nodules have enlarged, she continues to have hoarseness, no dysphagia/odynophagia, but has more phlegm. No SOB with lying down.   I reviewed pt's thyroid tests: Lab Results  Component Value Date   TSH 0.81 05/22/2015   TSH 0.77 07/18/2014   TSH 0.71 09/17/2013   FREET4 0.78 05/22/2015   FREET4 0.82 09/17/2013    Pt c/o: - + fatigue - + Weight gain  She tells me that she is interested in starting thyroid medication to help with her fatigue and weight gain...  Pt does  have a FH of thyroid ds in father and MGM. No FH of thyroid cancer. No h/o radiation tx to head or neck.  She also has a history of fibromyalgia >> better on Wellbutrin, but  worse after she stopped.   ROS: Constitutional: + see HPI,  Eyes: No blurry vision, no xerophthalmia ENT: + sore throat, see history of present illness Cardiovascular: no CP/SOB/palpitations/leg swelling Respiratory: no cough/SOB Gastrointestinal: no N/V/D/C Musculoskeletal: no muscle/joint aches Skin: no rashes Neurological: no tremors/numbness/tingling/dizziness  I reviewed pt's medications, allergies, PMH, social hx, family hx, and changes were documented in the history of present illness. Otherwise, unchanged from my initial visit note.  Past Medical History  Diagnosis Date  . History of chicken pox   . Depression   . History of migraine   . History of UTI   . Fibromyalgia   . Anxiety   . Endometriosis   . Fibroid   . Right thyroid nodule     endo eval summer 2015 - bx: benign follicular nodule   Past Surgical History  Procedure Laterality Date  . Tonsillectomy  1988  . Abdominal hysterectomy  2011  . Breast biopsy  2007    dense breast   History   Social History  . Marital Status: Married    Spouse Name: N/A  Number of Children: 1, 58 y/o   Occupational History  . Speech patholigst   Social History Main Topics  . Smoking status: Never Smoker   . Smokeless tobacco: Not on file  . Alcohol Use: No  . Drug Use: No  . Sexual Activity: Yes    Partners: Male     Comment: Hyst   Current Outpatient Prescriptions on File Prior to Visit  Medication Sig Dispense Refill  . ALPRAZolam (XANAX) 0.25 MG tablet Take 1 tablet (0.25 mg total) by mouth daily as needed for anxiety. 1/2 - 2 tabs by mouth twice per day as needed 30 tablet 0  . atenolol (TENORMIN) 50 MG tablet TAKE ONE TABLET BY MOUTH ONE TIME DAILY 90 tablet 2  . baclofen (LIORESAL) 10 MG tablet Take 1 tablet (10 mg total) by mouth 2 (two) times daily as needed for muscle spasms. 60 each 1  . butalbital-acetaminophen-caffeine (FIORICET) 50-325-40 MG tablet Take 1-2 tablets by mouth every 6 (six) hours as  needed for headache. 20 tablet 0  . cetirizine (ZYRTEC) 10 MG tablet Take 10 mg by mouth daily.    . cyclobenzaprine (FLEXERIL) 10 MG tablet Take 1 tablet (10 mg total) by mouth 3 (three) times daily as needed for muscle spasms. 30 tablet 0  . DULoxetine (CYMBALTA) 60 MG capsule Take 1 capsule (60 mg total) by mouth daily. 30 capsule 5  . traMADol (ULTRAM) 50 MG tablet Take 1 tablet (50 mg total) by mouth every 8 (eight) hours as needed. 60 tablet 0  . triamcinolone (NASACORT ALLERGY 24HR) 55 MCG/ACT AERO nasal inhaler Place 2 sprays into the nose daily.     No current facility-administered medications on file prior to visit.   Allergies  Allergen Reactions  . Doxycycline   . Erythromycin   . Keflex [Cephalexin]   . Penicillins   . Sulfa Antibiotics    Family History  Problem Relation Age of Onset  . Arthritis Mother   . Hyperlipidemia Mother   . Hypertension Mother   . Stroke Mother   . Mental illness Mother   . Arthritis Father   . Hyperlipidemia Father   . Hypertension Father   . Diabetes Father   . Heart disease Maternal Grandmother   . Stroke Maternal Grandmother   . Prostate cancer Paternal Grandfather   . Breast cancer Cousin   . Hypertension Brother    PE: BP 122/74 mmHg  Pulse 59  Temp(Src) 97.1 F (36.2 C) (Oral)  Resp 12  Wt 220 lb (99.791 kg)  SpO2 98% Wt Readings from Last 3 Encounters:  09/04/15 220 lb (99.791 kg)  07/22/15 212 lb 8 oz (96.389 kg)  06/24/15 210 lb 8 oz (95.482 kg)   Constitutional: overweight, in NAD Eyes: PERRLA, EOMI, no exophthalmos ENT: moist mucous membranes, central thyromegaly, no cervical lymphadenopathy Cardiovascular: RRR, No MRG Respiratory: CTA B Gastrointestinal: abdomen soft, NT, ND, BS+ Musculoskeletal: no deformities, strength intact in all 4;  Skin: moist, warm, no rashes Neurological: no tremor with outstretched hands, DTR normal in all 4  ASSESSMENT: 1.MNG 01/08/2014 - Thyroid U/S:  Right thyroid lobe: 5.3  x 2.2 x 1.9 cm. Cystic/minimally solid nodule measures 1.3 x 1.0 x 0.6 cm at the mid right thyroid.  Right mid thyroid lobe nodule, solid, 1.2 x 0.9 x 0.7 cm.  Right inferior thyroid lobe, predominantly cystic, 1.0 x 1.0 x 0.8 cm  Left thyroid lobe: 5.7 x 2.1 x 1.9 cm. Upper pole solid nodule measures 2.3 x 1.5  x 1.4 cm   Isthmus Thickness: 0.9 cm. Solid nodule measures 4.4 x 3.3 x 2.0 cm   Lymphadenopathy None visualized.  01/21/2014 - FNA: Adequacy Reason Satisfactory For Evaluation. Diagnosis THYROID, FINE NEEDLE ASPIRATION, LEFT UPPER POLE (SPECIMEN 1 OF 2 COLLECTED 01-21-2014) BENIGN. FINDINGS CONSISTENT WITH A BENIGN FOLLICULAR NODULE (BETHESDA CATEGORY II). Enid Cutter MD Pathologist, Electronic Signature (Case signed 01/22/2014) Specimen Clinical Information Goiter, LUP solid nodule 2.3 x 1.5 x 1.4cm Source Thyroid, Fine Needle Aspiration, LUP, (Specimen 1 of 2, collected on 01/21/2014)  Adequacy Reason Satisfactory For Evaluation. Diagnosis THYROID, FINE NEEDLE ASPIRATION, ISTHMUS (SPECIMEN 2 OF 2 COLLECTED 01-21-2014) BENIGN. FINDINGS CONSISTENT WITH A BENIGN FOLLICULAR NODULE (BETHESDA CATEGORY II). Enid Cutter MD Pathologist, Electronic Signature (Case signed 01/22/2014) Specimen Clinical Information Goiter, Solid nodule isthmus 4.4 x 3.3 x 2.0cm Source Thyroid, Fine Needle Aspiration, Isthmus, (Specimen 2 of 2, collected on 01/21/2014)  2. Weight gain  PLAN: 1. MNG  - We reviewed the report of her later thyroid ultrasound and the 2 biopsy along with the patient. I pointed out that the dominant nodules are large but they appear to be benign. She does have some neck compression symptoms and was wondering about thyroid surgery. We discussed about the fact that if her compression symptoms are not bothering her too much, I would not recommend surgery. However, if she feels that she has significant dysphagia, hoarseness, shortness of breath, no surgery would be  indicated. I would probably recommend hemithyroidectomy +/- isthmusectomy to try to preserve as much of her thyroid is possible. However, I suggested to do a barium swallow to see if there is exogenous compression from the thyroid on the esophagus. She refuses this. I suggested to obtain a new thyroid ultrasound to follow the nodules can maybe have a referral to surgery for a second opinion about the thyroidectomy. She agrees with this and has a Psychologist, sport and exercise in mind, but she forgot her name. She will let me know.  2. Weight gain - Patient would want me to start her on thyroid hormones to see if this would improve her weight gain and fatigue. I explained that this is not good practice, since her thyroid tests are normal and her TSH is even in the low normal range. I explained that the lower TSH would indicate an excess of thyroid hormones and a higher TSH would indicate a deficit of thyroid hormones, and not vice versa. I explained that her fatigue could come from many other reasons other than the thyroid and giving her thyroid hormones on the baseline of a normal thyroid function would not be a good idea. She became very unhappy about the fact that I would not prescribe her thyroid hormones and became withdrawn and monosyllabic during the rest of the appointment.  Orders Placed This Encounter  Procedures  . US Soft Tissue Head/Neck    CLINICAL DATA: 45 year old female with a history of thyroid nodules.  Prior thyroid biopsy 01/21/2014 of the left upper pole nodule and of the isthmic nodule  EXAM: THYROID ULTRASOUND  TECHNIQUE: Ultrasound examination of the thyroid gland and adjacent soft tissues was performed.  COMPARISON: 01/21/2014, ultrasound 01/08/2014  FINDINGS: Right thyroid lobe - Measurements: 8.7 cm x 3.9 cm x 3.6 cm. Diffusely heterogeneous and enlarged thyroid tissue with nodular/pseudo nodular appearance. - Previously biopsied nodule at the right isthmus: 4.4 cm x 3.7 cm x 4.1 cm.  No significant change in appearance or size. Composition is mixed cystic and solid (1), hyperechoic echogenicity (1), wider  than tall shape (0), smooth margin (0), and no echogenic foci (0). - Superior nodule: 1.4 cm x 1.1 cm x 1.6 cm. Composition solid (2), isoechoic echogenicity (1), wider than tall shape (0), smooth margin (0), and no echogenic foci (0). - Superior nodule: 1.4 cm x 1.5 cm x 0.9 cm. Composition is mixed cystic and solid (1), isoechoic echogenicity (1), wider than tall shape (0), smooth margin (0), and no internal reflectors (0). - Two measured nodules (3.6 cm and 2.1 cm) at the inferior thyroid are favored to represent pseudo nodularity.  Left thyroid lobe- Measurements: 6.1 cm x 2.1 cm x 2.0 cm. Diffusely heterogeneous and enlarged left thyroid tissue with nodular/pseudo nodular appearance. - Superior nodule measures 1.9 cm x 1.5 cm x 1.8 cm, previously biopsied. Mixed cystic and solid composition (1), isoechoic echogenicity (1), wider than tall shape (0), smooth margin (0), with no internal reflectors (0). - Mid left nodule measures 3.4 cm x 1.8 cm x 2.5 cm. Nearly completely solid (2), isoechoic echogenicity (1), wider than tall shape (0), smooth margin (0), with no internal echogenicity (0).  Isthmus Thickness: 1.1 cm. No nodules visualized.  Lymphadenopathy: None visualized.  IMPRESSION: Multinodular thyroid, with right isthmic nodule and the left upper nodule previously biopsied. Recommend correlation with prior biopsy results.  Mid left thyroid nodule (TR3 - >2.5cm) meets criteria for biopsy by the newly established ACR TI-RADS criteria, and referral for biopsy is recommended.  Remainder of the nodules do not meet criteria by the newly established ACR TI-RADS criteria, although superior nodule on the right meets criteria for surveillance (TR3). Recommended follow up surveillance for this nodule: 1 year, 3 years, 5 years. Imaging may stop at that time if these remain  unchanged.  Recommendations follow those established by the new ACR TI-RADS criteria (J Am Coll Radiol [id number]).  Signed, Dulcy Fanny. Earleen Newport, DO [facility] Electronically Signed By: Corrie Mckusick D.O. On: 09/12/2015 08:51  Multiple thyroid nodules, of which the isthmic one appears stable (patient was concerned that this has enlarged). The left mid lobe nodule is an indication for biopsy due to size. I will suggest this to the patient.

## 2015-09-08 ENCOUNTER — Other Ambulatory Visit: Payer: Self-pay | Admitting: Internal Medicine

## 2015-09-08 ENCOUNTER — Ambulatory Visit: Payer: BC Managed Care – PPO | Admitting: Family Medicine

## 2015-09-08 DIAGNOSIS — E042 Nontoxic multinodular goiter: Secondary | ICD-10-CM

## 2015-09-08 DIAGNOSIS — Z0289 Encounter for other administrative examinations: Secondary | ICD-10-CM

## 2015-09-11 ENCOUNTER — Ambulatory Visit
Admission: RE | Admit: 2015-09-11 | Discharge: 2015-09-11 | Disposition: A | Discharge status: Home / Self Care | Payer: BC Managed Care – PPO | Source: Ambulatory Visit | Attending: Internal Medicine | Admitting: Internal Medicine

## 2015-09-17 ENCOUNTER — Telehealth: Payer: Self-pay | Admitting: Family Medicine

## 2015-09-17 DIAGNOSIS — E049 Nontoxic goiter, unspecified: Secondary | ICD-10-CM

## 2015-09-17 NOTE — Telephone Encounter (Signed)
Patient is requesting referral to endocrinologist at [facility]. She would like to switch from [facility] to [facility].

## 2015-09-21 ENCOUNTER — Telehealth: Payer: Self-pay | Admitting: Family Medicine

## 2015-09-21 NOTE — Telephone Encounter (Signed)
Pt is scheduled to be seen on Wednesday(09/23/15) for followup.  However, she states she is going to have thyroid surgery and is requesting orders for labs so she can have lab work done prior to her appt with pcp on Wednesday.  Pt requesting to go to [place] lab for labwork.

## 2015-09-21 NOTE — Telephone Encounter (Signed)
Pt is scheduled for a follow up on ger back with [place]. I did not need labs, so I am not certain what she is referring.  - if she is requesting pre-surgery labs, that order would come from whoever she is having surgery.

## 2015-09-22 NOTE — Telephone Encounter (Signed)
Spoke with patient she states she will discuss with Dr Raoul Pitch at her appt. She is having fatigue.

## 2015-09-23 ENCOUNTER — Ambulatory Visit: Payer: BC Managed Care – PPO | Admitting: Family Medicine

## 2015-09-23 DIAGNOSIS — Z0289 Encounter for other administrative examinations: Secondary | ICD-10-CM

## 2015-10-01 ENCOUNTER — Ambulatory Visit
Admission: RE | Admit: 2015-10-01 | Discharge: 2015-10-01 | Disposition: A | Discharge status: Home / Self Care | Payer: BC Managed Care – PPO | Source: Ambulatory Visit

## 2015-10-01 DIAGNOSIS — Z1231 Encounter for screening mammogram for malignant neoplasm of breast: Secondary | ICD-10-CM

## 2015-10-09 ENCOUNTER — Ambulatory Visit: Payer: BC Managed Care – PPO

## 2015-10-13 IMAGING — CR DG CHEST 2V
2 series · 2 of 2 positions shown · non-contrast
Comparison: None.

CLINICAL DATA: Left side chest pain for 2 weeks

EXAM:
CHEST  2 VIEW

[view not recorded (1 of 2)]
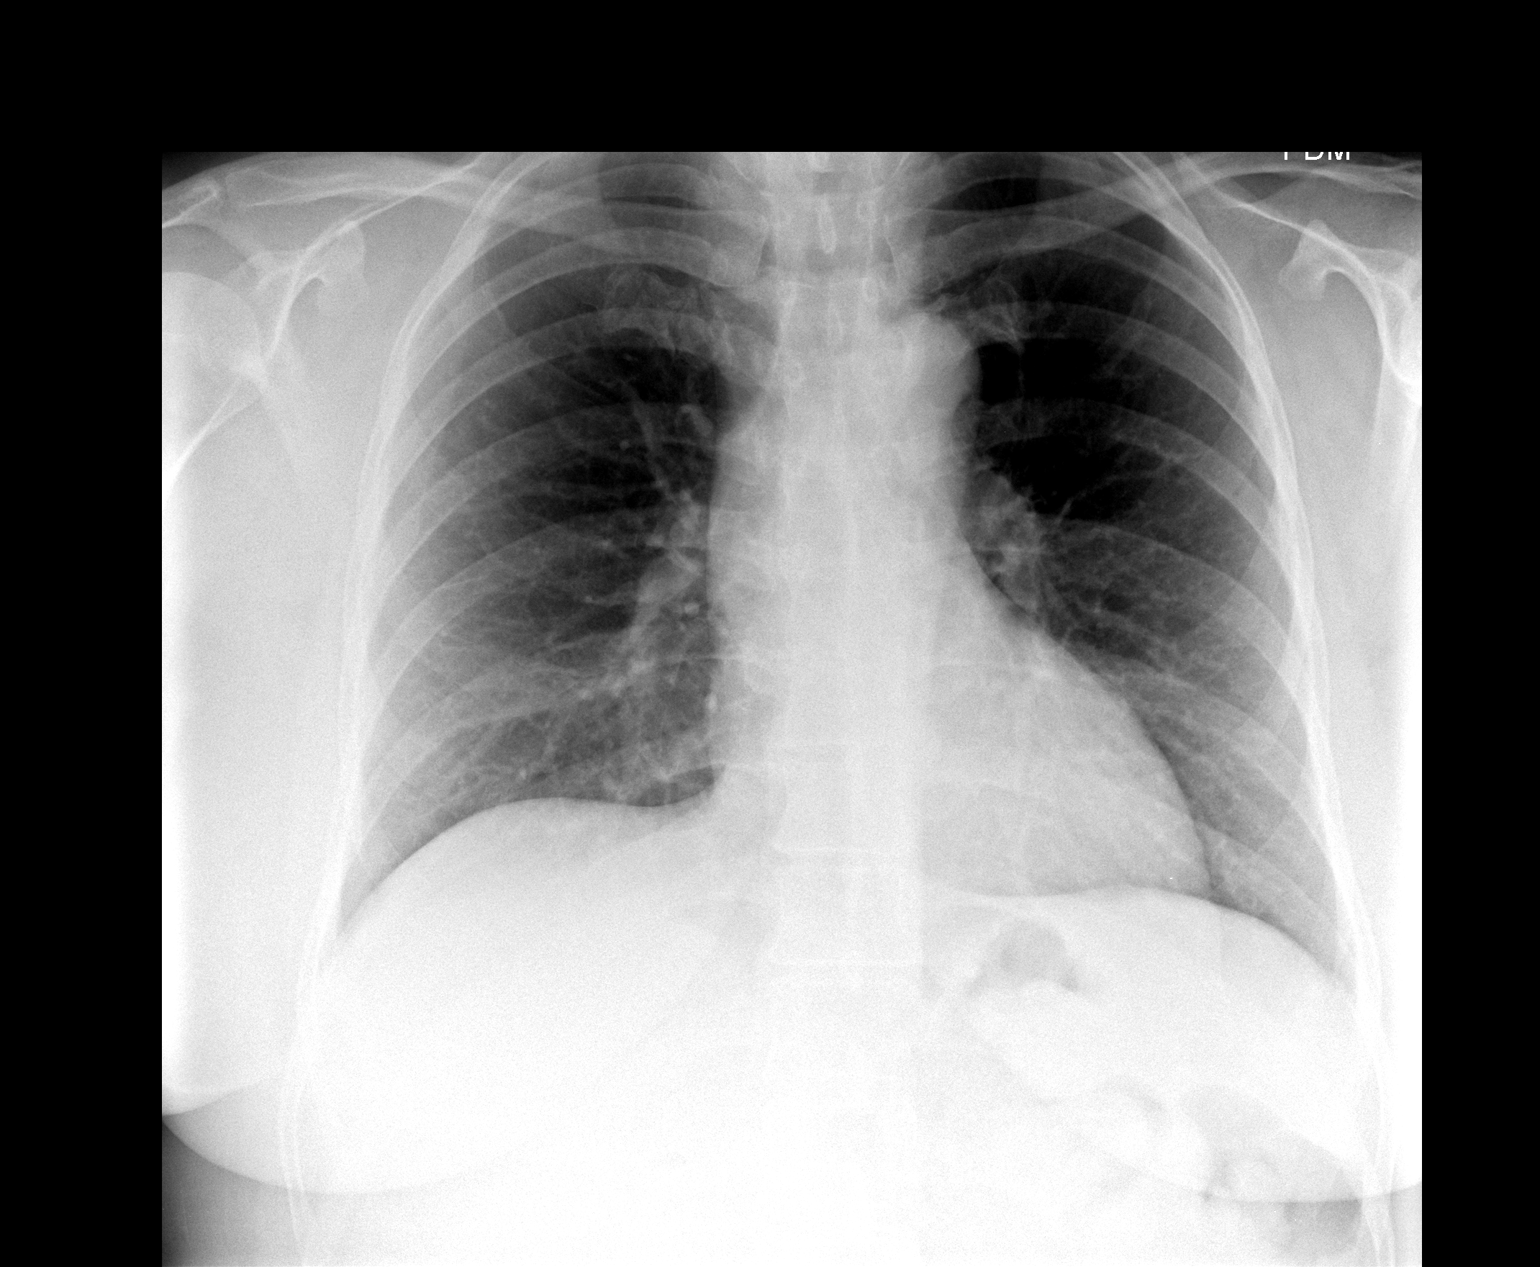

[view not recorded (2 of 2)]
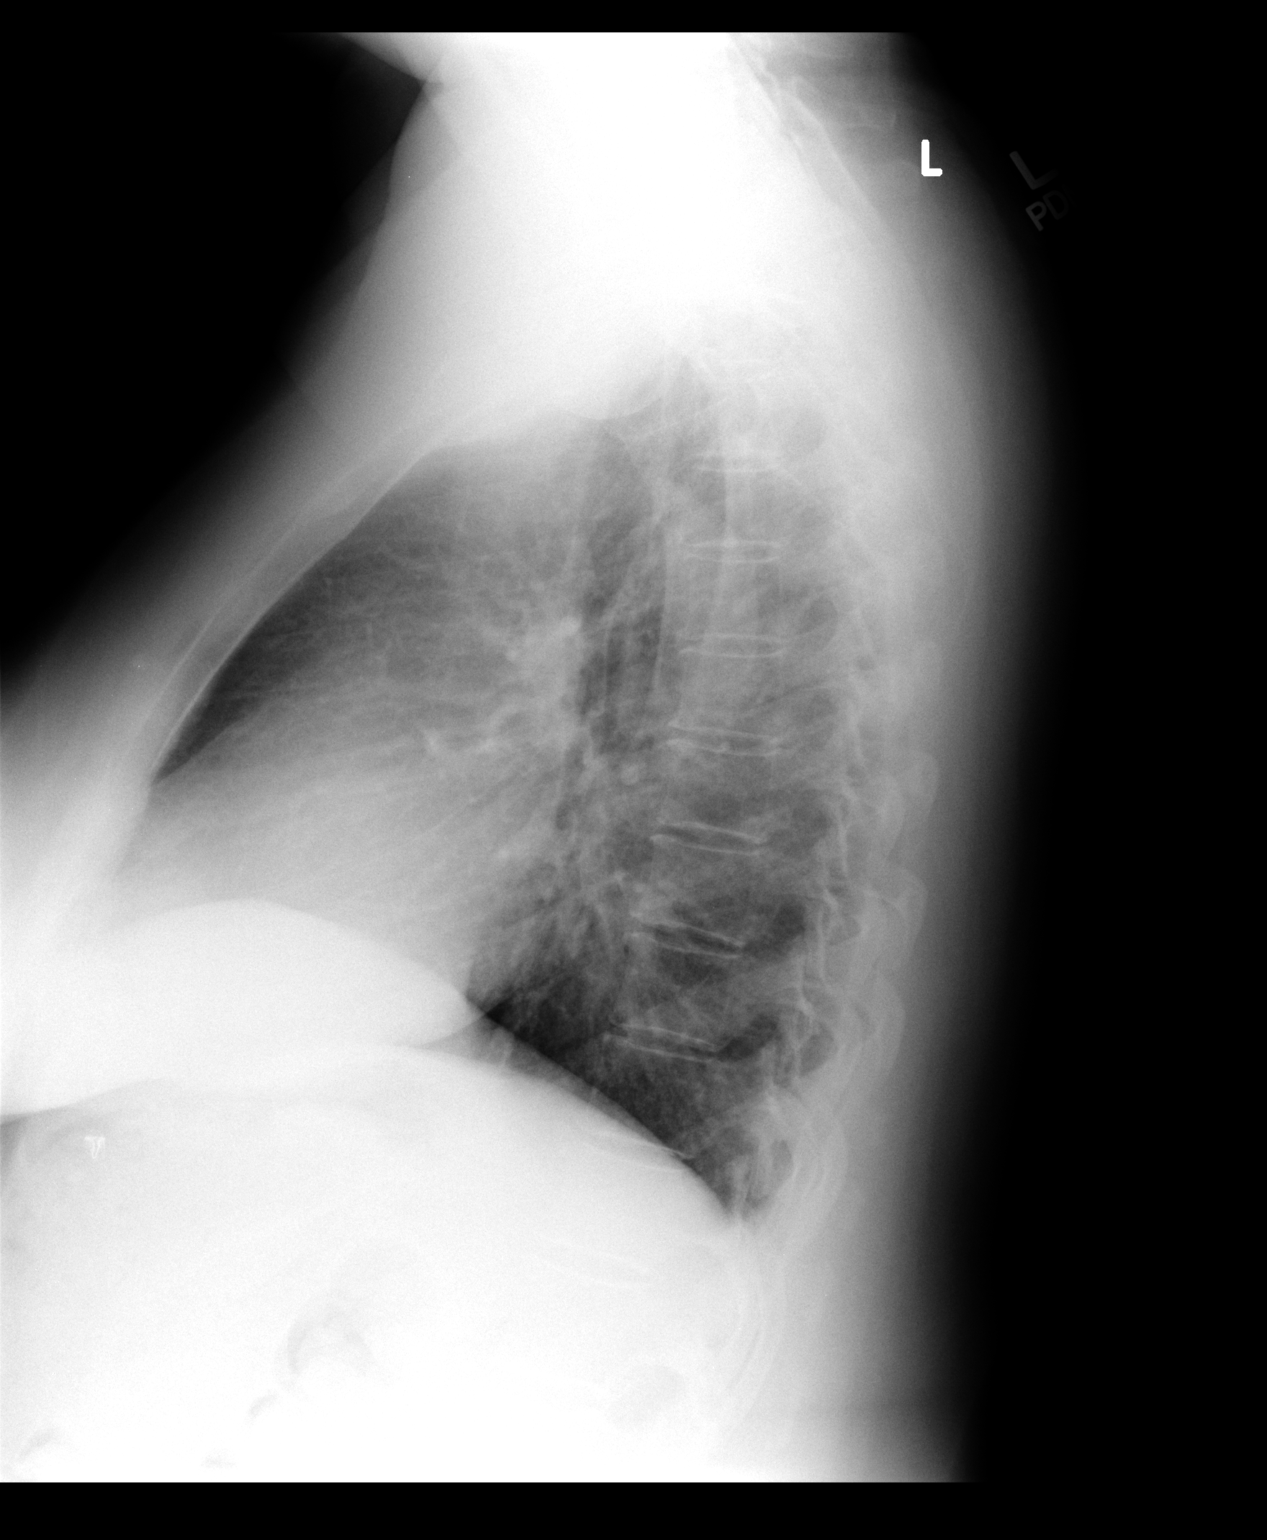

[2 of 2 positions shown; findings below may reference images not displayed]

FINDINGS: The heart size and mediastinal contours are within normal limits.
Both lungs are clear. The visualized skeletal structures are
unremarkable.
IMPRESSION: No active cardiopulmonary disease.

## 2016-01-09 ENCOUNTER — Other Ambulatory Visit: Payer: Self-pay | Admitting: Internal Medicine

## 2016-01-12 NOTE — Telephone Encounter (Signed)
Pt requesting refills on Cymbalta. This was prescribed by her prior PCP, but she has not been seen by this provider for this issue. She transferred from another [facility] provider after an acute OV. She will need to be seen for this issue.  - We can refill x1 month until she is able to schedule. Please make her aware.

## 2016-01-12 NOTE — Telephone Encounter (Signed)
Spoke with patient she has changed to a new PCP and will have Rx sent to new Dr.

## 2016-03-21 ENCOUNTER — Other Ambulatory Visit: Payer: Self-pay | Admitting: Internal Medicine

## 2016-07-24 IMAGING — DX DG LUMBAR SPINE COMPLETE 4+V
5 series · 5 of 5 positions shown · non-contrast
Comparison: No prior.

CLINICAL DATA: Back pain.

EXAM:
LUMBAR SPINE - COMPLETE 4+ VIEW

[l-spine ap]
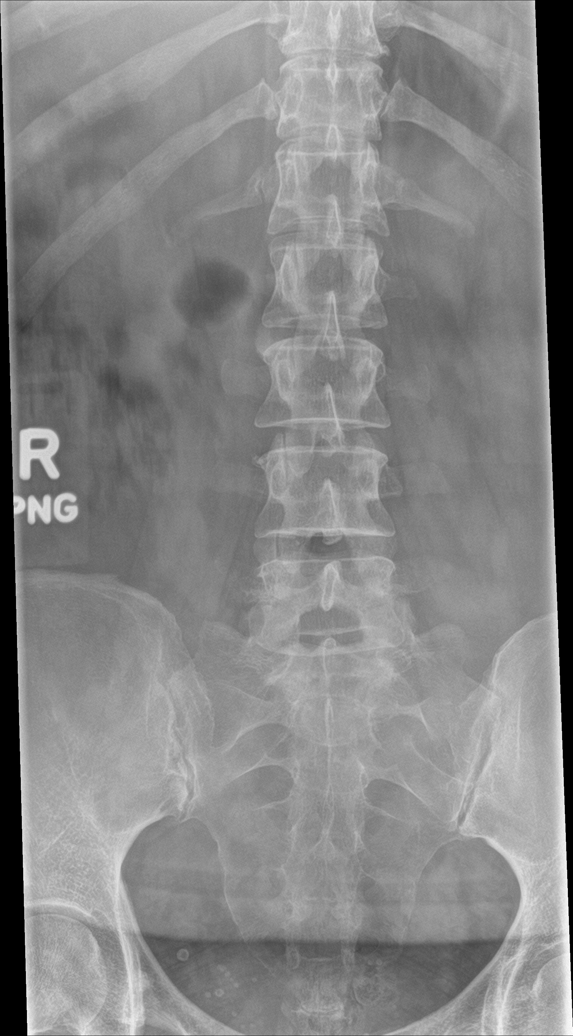

[l-spine obl (1 of 2)]
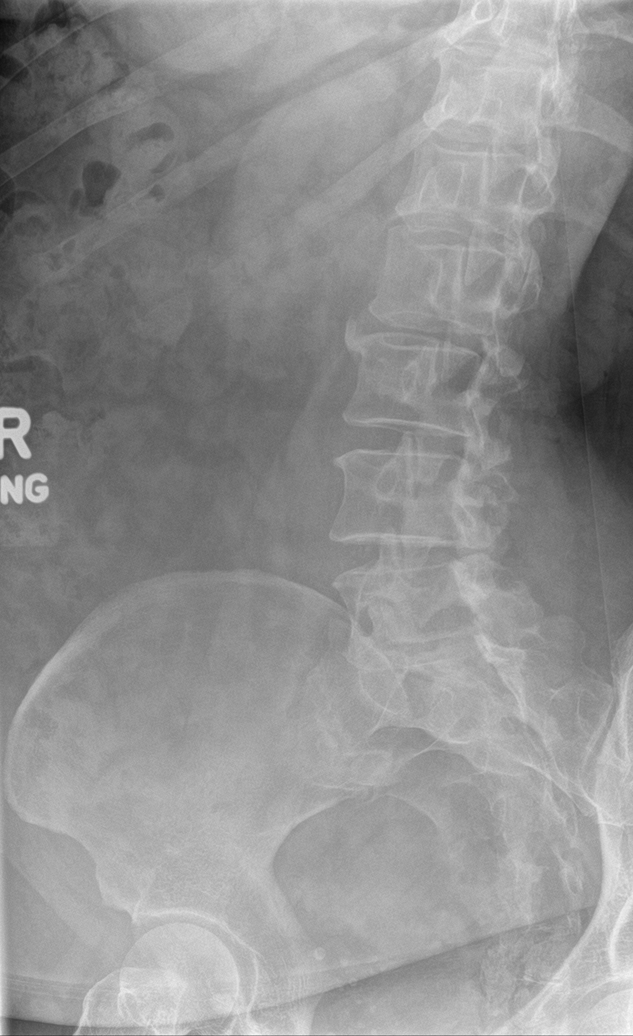

[l-spine obl (2 of 2)]
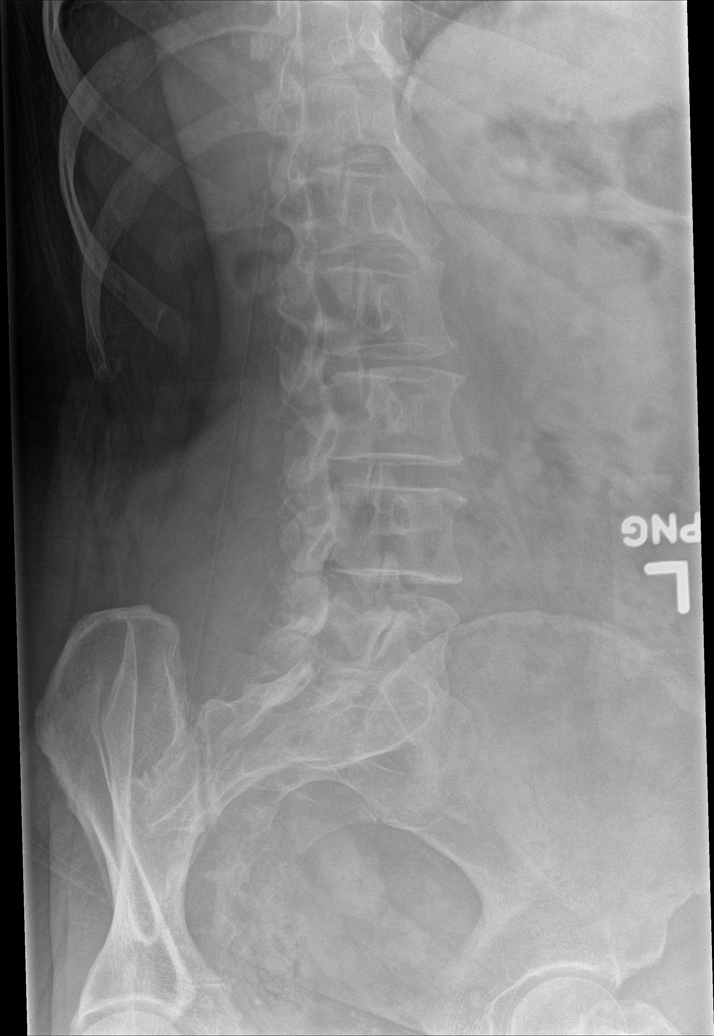

[l-spine lat]
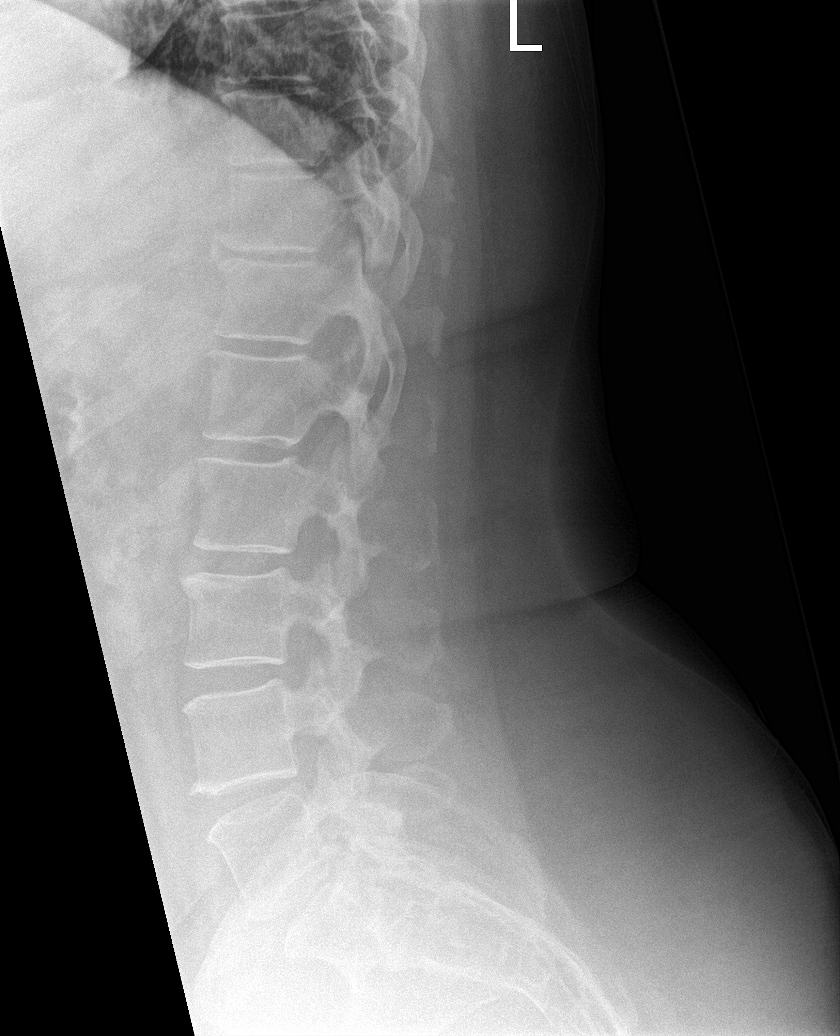

[l-spine spot]
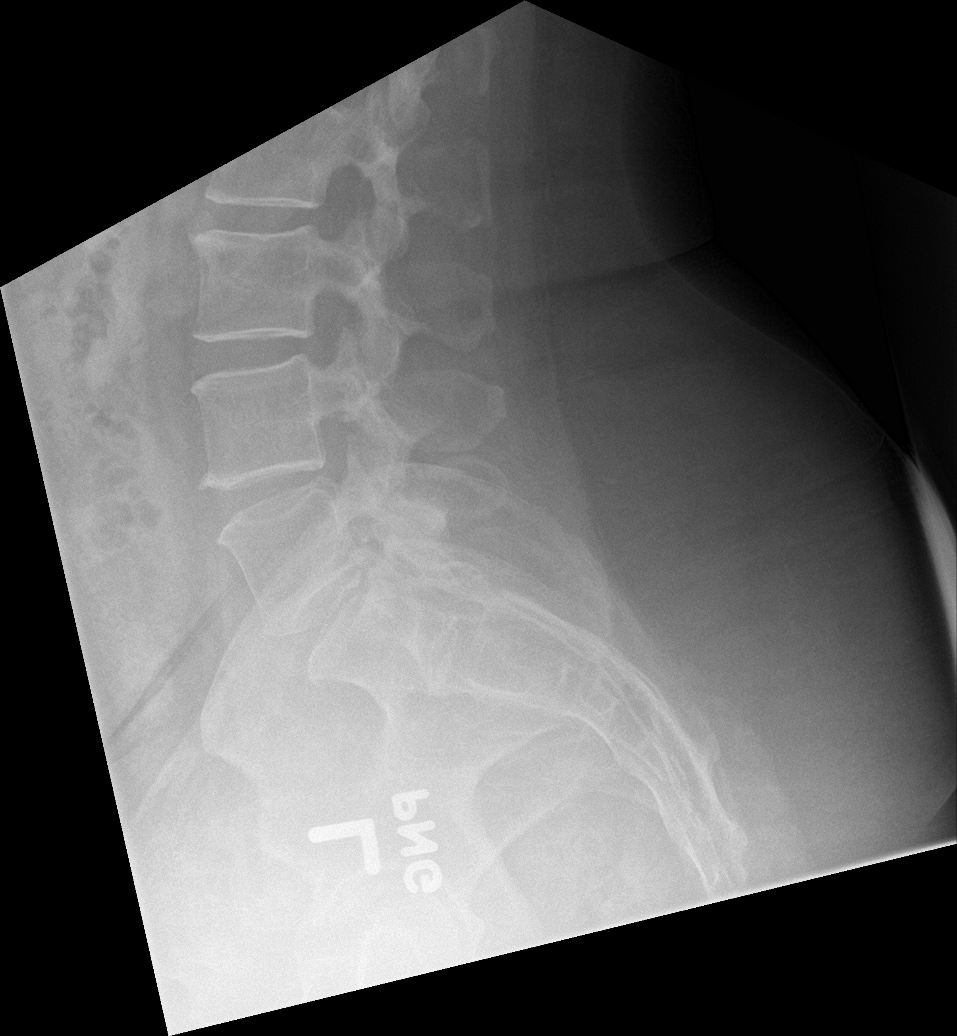

[5 of 5 positions shown; findings below may reference images not displayed]

FINDINGS: Paraspinal soft tissues are normal. No acute bony abnormality
identified. Multilevel mild degenerative change with
IMPRESSION: No acute abnormality.  Multilevel degenerative change .

## 2016-12-15 ENCOUNTER — Other Ambulatory Visit: Payer: Self-pay | Admitting: Internal Medicine

## 2017-01-15 ENCOUNTER — Other Ambulatory Visit: Payer: Self-pay | Admitting: Internal Medicine

## 2017-05-10 ENCOUNTER — Other Ambulatory Visit: Payer: Self-pay | Admitting: Internal Medicine

## 2019-02-09 ENCOUNTER — Emergency Department (HOSPITAL_COMMUNITY)

## 2019-02-09 ENCOUNTER — Emergency Department (HOSPITAL_COMMUNITY)
Admission: EM | Admit: 2019-02-09 | Discharge: 2019-02-09 | Disposition: A | Discharge status: Home / Self Care | Source: Home / Self Care | Attending: Emergency Medicine | Admitting: Emergency Medicine

## 2019-02-09 ENCOUNTER — Other Ambulatory Visit: Payer: Self-pay

## 2019-02-09 ENCOUNTER — Emergency Department (HOSPITAL_COMMUNITY)
Admission: EM | Admit: 2019-02-09 | Discharge: 2019-02-09 | Disposition: A | Discharge status: Home / Self Care | Attending: Emergency Medicine | Admitting: Emergency Medicine

## 2019-02-09 DIAGNOSIS — R42 Dizziness and giddiness: Secondary | ICD-10-CM | POA: Diagnosis not present

## 2019-02-09 DIAGNOSIS — Y929 Unspecified place or not applicable: Secondary | ICD-10-CM | POA: Insufficient documentation

## 2019-02-09 DIAGNOSIS — Z79899 Other long term (current) drug therapy: Secondary | ICD-10-CM | POA: Insufficient documentation

## 2019-02-09 DIAGNOSIS — Z5321 Procedure and treatment not carried out due to patient leaving prior to being seen by health care provider: Secondary | ICD-10-CM | POA: Diagnosis not present

## 2019-02-09 DIAGNOSIS — Y999 Unspecified external cause status: Secondary | ICD-10-CM | POA: Insufficient documentation

## 2019-02-09 DIAGNOSIS — S40011A Contusion of right shoulder, initial encounter: Secondary | ICD-10-CM | POA: Insufficient documentation

## 2019-02-09 DIAGNOSIS — W19XXXA Unspecified fall, initial encounter: Secondary | ICD-10-CM

## 2019-02-09 DIAGNOSIS — R519 Headache, unspecified: Secondary | ICD-10-CM | POA: Insufficient documentation

## 2019-02-09 DIAGNOSIS — W01198A Fall on same level from slipping, tripping and stumbling with subsequent striking against other object, initial encounter: Secondary | ICD-10-CM | POA: Insufficient documentation

## 2019-02-09 DIAGNOSIS — S0011XA Contusion of right eyelid and periocular area, initial encounter: Secondary | ICD-10-CM | POA: Insufficient documentation

## 2019-02-09 DIAGNOSIS — Y939 Activity, unspecified: Secondary | ICD-10-CM | POA: Insufficient documentation

## 2019-02-09 NOTE — ED Notes (Signed)
Patient verbalizes understanding of discharge instructions. Opportunity for questioning and answers were provided. Armband removed by staff, pt discharged from ED.  

## 2019-02-09 NOTE — ED Provider Notes (Signed)
[facility] EMERGENCY DEPARTMENT Provider Note   CSN: [id number] Arrival date & time: 02/09/19  1707     History   Chief Complaint Chief Complaint  Patient presents with   Fall    HPI [name] is a 48 y.o. female.     Patient slipped and fell and hit her forehead and right shoulder.  No loss of consciousness  The history is provided by the patient. No language interpreter was used.  Fall This is a new problem. The current episode started 3 to 5 hours ago. The problem occurs rarely. The problem has been resolved. Pertinent negatives include no chest pain, no abdominal pain and no headaches. Nothing aggravates the symptoms. Nothing relieves the symptoms. She has tried nothing for the symptoms.    Past Medical History:  Diagnosis Date   Anxiety    Depression    Endometriosis    Fibroid    Fibromyalgia    History of chicken pox    History of migraine    History of UTI    Right thyroid nodule    endo eval summer 2015 - bx: benign follicular nodule    Patient Active Problem List   Diagnosis Date Noted   Lumbar pain 06/24/2015   Sinusitis, chronic 05/24/2015   Pre-employment examination 03/11/2015   Chest pain 10/10/2014   Panic 10/10/2014   Mixed headache 05/30/2014   Goiter 09/18/2013   Hair loss    Depression with anxiety    Fibromyalgia     Past Surgical History:  Procedure Laterality Date   ABDOMINAL HYSTERECTOMY  2011   BREAST BIOPSY  2007   dense breast   TONSILLECTOMY  1988     OB History    Gravida  1   Para      Term      Preterm      AB  1   Living        SAB  1   TAB      Ectopic      Multiple      Live Births               Home Medications    Prior to Admission medications   Medication Sig Start Date End Date Taking? Authorizing Provider  ALPRAZolam (XANAX) 0.25 MG tablet Take 1 tablet (0.25 mg total) by mouth daily as needed for anxiety. 1/2 - 2 tabs by mouth twice per  day as needed 05/22/15   Hoyt Koch, MD  atenolol (TENORMIN) 50 MG tablet Take 1 tablet (50 mg total) by mouth daily. Need office visit for further refills 12/15/16   Hoyt Koch, MD  baclofen (LIORESAL) 10 MG tablet Take 1 tablet (10 mg total) by mouth 2 (two) times daily as needed for muscle spasms. 07/22/15   Kuneff, Renee A, DO  cetirizine (ZYRTEC) 10 MG tablet Take 10 mg by mouth daily.    [provider]  cyclobenzaprine (FLEXERIL) 10 MG tablet Take 1 tablet (10 mg total) by mouth 3 (three) times daily as needed for muscle spasms. 06/24/15   Kuneff, Renee A, DO  DULoxetine (CYMBALTA) 60 MG capsule Take 1 capsule (60 mg total) by mouth daily. 05/22/15   Hoyt Koch, MD  traMADol (ULTRAM) 50 MG tablet Take 1 tablet (50 mg total) by mouth every 8 (eight) hours as needed. 06/24/15   Kuneff, Renee A, DO  triamcinolone (NASACORT ALLERGY 24HR) 55 MCG/ACT AERO nasal inhaler Place 2 sprays into  the nose daily.    [provider]    Family History Family History  Problem Relation Age of Onset   Arthritis Mother    Hyperlipidemia Mother    Hypertension Mother    Stroke Mother    Mental illness Mother    Arthritis Father    Hyperlipidemia Father    Hypertension Father    Diabetes Father    Heart disease Maternal Grandmother    Stroke Maternal Grandmother    Prostate cancer Paternal Grandfather    Breast cancer Cousin    Hypertension Brother     Social History Social History   Tobacco Use   Smoking status: Never Smoker  Substance Use Topics   Alcohol use: No   Drug use: No     Allergies   Doxycycline, Erythromycin, Keflex [cephalexin], Penicillins, and Sulfa antibiotics   Review of Systems Review of Systems  Constitutional: Negative for appetite change and fatigue.  HENT: Negative for congestion, ear discharge and sinus pressure.        Headache  Eyes: Negative for discharge.  Respiratory: Negative for cough.    Cardiovascular: Negative for chest pain.  Gastrointestinal: Negative for abdominal pain and diarrhea.  Genitourinary: Negative for frequency and hematuria.  Musculoskeletal: Negative for back pain.       Right shoulder pain  Skin: Negative for rash.  Neurological: Negative for seizures and headaches.  Psychiatric/Behavioral: Negative for hallucinations.     Physical Exam Updated Vital Signs BP 125/76    Pulse (!) 55    Temp 98.2 F (36.8 C) (Oral)    Resp 11    SpO2 98%   Physical Exam Vitals signs and nursing note reviewed.  Constitutional:      Appearance: She is well-developed.  HENT:     Head:     Comments: Swelling around right orbit    Nose: Nose normal.  Eyes:     General: No scleral icterus.    Conjunctiva/sclera: Conjunctivae normal.  Neck:     Musculoskeletal: Neck supple.     Thyroid: No thyromegaly.  Cardiovascular:     Rate and Rhythm: Normal rate and regular rhythm.     Heart sounds: No murmur. No friction rub. No gallop.   Pulmonary:     Breath sounds: No stridor. No wheezing or rales.  Chest:     Chest wall: No tenderness.  Abdominal:     General: There is no distension.     Tenderness: There is no abdominal tenderness. There is no rebound.  Musculoskeletal:     Comments: Tender right shoulder  Lymphadenopathy:     Cervical: No cervical adenopathy.  Skin:    Findings: No erythema or rash.  Neurological:     Mental Status: She is oriented to person, place, and time.     Motor: No abnormal muscle tone.     Coordination: Coordination normal.  Psychiatric:        Behavior: Behavior normal.      ED Treatments / Results  Labs (all labs ordered are listed, but only abnormal results are displayed) Labs Reviewed - No data to display  EKG None  Radiology Dg Shoulder Right  Result Date: 02/09/2019 CLINICAL DATA:  Anterior right shoulder pain today s/p fall. Pt states she tripped over her dog's kennel and landed on her right side. No hx of  previous right shoulder injuries or surgeries. EXAM: RIGHT SHOULDER - 2+ VIEW COMPARISON:  None. FINDINGS: There is no evidence of fracture or dislocation. There  is no evidence of arthropathy or other focal bone abnormality. Soft tissues are unremarkable. IMPRESSION: Negative. Electronically Signed   By: Lajean Manes M.D.   On: 02/09/2019 18:23   Ct Head Wo Contrast  Result Date: 02/09/2019 CLINICAL DATA:  Pt said she felt some dizziness prior to a fall this afternoon. Pt sts she hit right side of face against door. Denies LOC. Small hematoma above right eye. EXAM: CT HEAD WITHOUT CONTRAST CT MAXILLOFACIAL WITHOUT CONTRAST CT CERVICAL SPINE WITHOUT CONTRAST TECHNIQUE: Multidetector CT imaging of the head, cervical spine, and maxillofacial structures were performed using the standard protocol without intravenous contrast. Multiplanar CT image reconstructions of the cervical spine and maxillofacial structures were also generated. COMPARISON:  None. FINDINGS: CT HEAD FINDINGS Brain: No evidence of acute infarction, hemorrhage, hydrocephalus, extra-axial collection or mass lesion/mass effect. Vascular: No hyperdense vessel or unexpected calcification. Skull: Normal. Negative for fracture or focal lesion. Other: Right frontal scalp contusion/hematoma. CT MAXILLOFACIAL FINDINGS Osseous: No fracture or mandibular dislocation. No destructive process. Orbits: Right frontal scalp hematoma extends to the preseptal periorbital soft tissues on the right. No injury to the right globe. No abnormality of the postseptal right orbit. Normal left globe and orbit. Sinuses: Clear sinuses, mastoid air cells and middle ear cavities. Soft tissues: No other soft tissue abnormality. CT CERVICAL SPINE FINDINGS Alignment: Normal. Skull base and vertebrae: No acute fracture. No primary bone lesion or focal pathologic process. Soft tissues and spinal canal: No prevertebral fluid or swelling. No visible canal hematoma. Disc levels: Mild  spondylotic disc bulging at C4-C5. Mild loss of disc height with disc bulging at C5-C6. Moderate loss of disc height with disc bulging at C6-C7. No convincing disc herniation. No significant stenosis. Upper chest: No acute findings.  Clear lung apices. Other: None. IMPRESSION: HEAD CT 1. No intracranial abnormality. 2. No skull fracture. 3. Right frontal scalp hematoma. MAXILLOFACIAL CT 1. No fracture. 2. Right frontal scalp hematoma extends to the preseptal periorbital soft tissues. No injury to the right globe or postseptal orbit. No other abnormality. CERVICAL CT 1. No fracture or acute finding. Electronically Signed   By: Lajean Manes M.D.   On: 02/09/2019 18:42   Ct Cervical Spine Wo Contrast  Result Date: 02/09/2019 CLINICAL DATA:  Pt said she felt some dizziness prior to a fall this afternoon. Pt sts she hit right side of face against door. Denies LOC. Small hematoma above right eye. EXAM: CT HEAD WITHOUT CONTRAST CT MAXILLOFACIAL WITHOUT CONTRAST CT CERVICAL SPINE WITHOUT CONTRAST TECHNIQUE: Multidetector CT imaging of the head, cervical spine, and maxillofacial structures were performed using the standard protocol without intravenous contrast. Multiplanar CT image reconstructions of the cervical spine and maxillofacial structures were also generated. COMPARISON:  None. FINDINGS: CT HEAD FINDINGS Brain: No evidence of acute infarction, hemorrhage, hydrocephalus, extra-axial collection or mass lesion/mass effect. Vascular: No hyperdense vessel or unexpected calcification. Skull: Normal. Negative for fracture or focal lesion. Other: Right frontal scalp contusion/hematoma. CT MAXILLOFACIAL FINDINGS Osseous: No fracture or mandibular dislocation. No destructive process. Orbits: Right frontal scalp hematoma extends to the preseptal periorbital soft tissues on the right. No injury to the right globe. No abnormality of the postseptal right orbit. Normal left globe and orbit. Sinuses: Clear sinuses, mastoid  air cells and middle ear cavities. Soft tissues: No other soft tissue abnormality. CT CERVICAL SPINE FINDINGS Alignment: Normal. Skull base and vertebrae: No acute fracture. No primary bone lesion or focal pathologic process. Soft tissues and spinal canal: No prevertebral fluid or swelling.  No visible canal hematoma. Disc levels: Mild spondylotic disc bulging at C4-C5. Mild loss of disc height with disc bulging at C5-C6. Moderate loss of disc height with disc bulging at C6-C7. No convincing disc herniation. No significant stenosis. Upper chest: No acute findings.  Clear lung apices. Other: None. IMPRESSION: HEAD CT 1. No intracranial abnormality. 2. No skull fracture. 3. Right frontal scalp hematoma. MAXILLOFACIAL CT 1. No fracture. 2. Right frontal scalp hematoma extends to the preseptal periorbital soft tissues. No injury to the right globe or postseptal orbit. No other abnormality. CERVICAL CT 1. No fracture or acute finding. Electronically Signed   By: Lajean Manes M.D.   On: 02/09/2019 18:42   Ct Maxillofacial Wo Contrast  Result Date: 02/09/2019 CLINICAL DATA:  Pt said she felt some dizziness prior to a fall this afternoon. Pt sts she hit right side of face against door. Denies LOC. Small hematoma above right eye. EXAM: CT HEAD WITHOUT CONTRAST CT MAXILLOFACIAL WITHOUT CONTRAST CT CERVICAL SPINE WITHOUT CONTRAST TECHNIQUE: Multidetector CT imaging of the head, cervical spine, and maxillofacial structures were performed using the standard protocol without intravenous contrast. Multiplanar CT image reconstructions of the cervical spine and maxillofacial structures were also generated. COMPARISON:  None. FINDINGS: CT HEAD FINDINGS Brain: No evidence of acute infarction, hemorrhage, hydrocephalus, extra-axial collection or mass lesion/mass effect. Vascular: No hyperdense vessel or unexpected calcification. Skull: Normal. Negative for fracture or focal lesion. Other: Right frontal scalp contusion/hematoma. CT  MAXILLOFACIAL FINDINGS Osseous: No fracture or mandibular dislocation. No destructive process. Orbits: Right frontal scalp hematoma extends to the preseptal periorbital soft tissues on the right. No injury to the right globe. No abnormality of the postseptal right orbit. Normal left globe and orbit. Sinuses: Clear sinuses, mastoid air cells and middle ear cavities. Soft tissues: No other soft tissue abnormality. CT CERVICAL SPINE FINDINGS Alignment: Normal. Skull base and vertebrae: No acute fracture. No primary bone lesion or focal pathologic process. Soft tissues and spinal canal: No prevertebral fluid or swelling. No visible canal hematoma. Disc levels: Mild spondylotic disc bulging at C4-C5. Mild loss of disc height with disc bulging at C5-C6. Moderate loss of disc height with disc bulging at C6-C7. No convincing disc herniation. No significant stenosis. Upper chest: No acute findings.  Clear lung apices. Other: None. IMPRESSION: HEAD CT 1. No intracranial abnormality. 2. No skull fracture. 3. Right frontal scalp hematoma. MAXILLOFACIAL CT 1. No fracture. 2. Right frontal scalp hematoma extends to the preseptal periorbital soft tissues. No injury to the right globe or postseptal orbit. No other abnormality. CERVICAL CT 1. No fracture or acute finding. Electronically Signed   By: Lajean Manes M.D.   On: 02/09/2019 18:42    Procedures Procedures (including critical care time)  Medications Ordered in ED Medications - No data to display   Initial Impression / Assessment and Plan / ED Course  I have reviewed the triage vital signs and the nursing notes.  Pertinent labs & imaging results that were available during my care of the patient were reviewed by me and considered in my medical decision making (see chart for details).        CT scan head neck and face shows no fractures or bleeding.  Patient does have contusion of the right orbit.  And contusion to right shoulder.  She will take Tylenol or  Motrin follow-up with PCP Final Clinical Impressions(s) / ED Diagnoses   Final diagnoses:  Fall, initial encounter    ED Discharge Orders  None       Milton Ferguson, MD 02/09/19 1900

## 2019-02-09 NOTE — Discharge Instructions (Addendum)
Take Tylenol or Motrin for pain follow-up with your family doctor if any problems

## 2019-02-09 NOTE — ED Notes (Signed)
Patient transported to X-ray 

## 2019-02-09 NOTE — ED Triage Notes (Signed)
Pt here stating she tripped and fell about two hours ago, hitting her right forehead on a brick edge. Denies dizziness prior to fall. Pt has pain to head, R cheekbone, R shoulder, and sts she can't move her right eye to look up. Endorses dizziness, worse with movement. Denies LOC, sts she just "saw stars." Took two naproxen for pain relief PTA.

## 2019-08-15 ENCOUNTER — Encounter (INDEPENDENT_AMBULATORY_CARE_PROVIDER_SITE_OTHER): Payer: Self-pay | Admitting: Ophthalmology

## 2019-11-18 ENCOUNTER — Encounter (INDEPENDENT_AMBULATORY_CARE_PROVIDER_SITE_OTHER): Admitting: Ophthalmology

## 2019-11-18 ENCOUNTER — Other Ambulatory Visit: Payer: Self-pay

## 2019-11-18 DIAGNOSIS — H35372 Puckering of macula, left eye: Secondary | ICD-10-CM | POA: Diagnosis not present

## 2019-11-18 DIAGNOSIS — H35033 Hypertensive retinopathy, bilateral: Secondary | ICD-10-CM | POA: Diagnosis not present

## 2019-11-18 DIAGNOSIS — I10 Essential (primary) hypertension: Secondary | ICD-10-CM | POA: Diagnosis not present

## 2019-11-18 DIAGNOSIS — H4423 Degenerative myopia, bilateral: Secondary | ICD-10-CM

## 2019-11-18 DIAGNOSIS — H43813 Vitreous degeneration, bilateral: Secondary | ICD-10-CM

## 2019-11-18 DIAGNOSIS — H2513 Age-related nuclear cataract, bilateral: Secondary | ICD-10-CM

## 2019-11-26 LAB — HM MAMMOGRAPHY

## 2019-12-17 ENCOUNTER — Ambulatory Visit: Admitting: Professional

## 2019-12-17 ENCOUNTER — Encounter (INDEPENDENT_AMBULATORY_CARE_PROVIDER_SITE_OTHER): Admitting: Ophthalmology

## 2019-12-19 ENCOUNTER — Ambulatory Visit (INDEPENDENT_AMBULATORY_CARE_PROVIDER_SITE_OTHER): Admitting: Professional

## 2019-12-19 DIAGNOSIS — F4310 Post-traumatic stress disorder, unspecified: Secondary | ICD-10-CM | POA: Diagnosis not present

## 2019-12-19 DIAGNOSIS — F331 Major depressive disorder, recurrent, moderate: Secondary | ICD-10-CM | POA: Diagnosis not present

## 2019-12-23 ENCOUNTER — Ambulatory Visit (INDEPENDENT_AMBULATORY_CARE_PROVIDER_SITE_OTHER): Admitting: Professional

## 2019-12-23 DIAGNOSIS — F331 Major depressive disorder, recurrent, moderate: Secondary | ICD-10-CM

## 2019-12-23 DIAGNOSIS — F4310 Post-traumatic stress disorder, unspecified: Secondary | ICD-10-CM | POA: Diagnosis not present

## 2019-12-26 ENCOUNTER — Ambulatory Visit (INDEPENDENT_AMBULATORY_CARE_PROVIDER_SITE_OTHER): Admitting: Professional

## 2019-12-26 DIAGNOSIS — F4310 Post-traumatic stress disorder, unspecified: Secondary | ICD-10-CM

## 2019-12-26 DIAGNOSIS — F331 Major depressive disorder, recurrent, moderate: Secondary | ICD-10-CM

## 2019-12-30 ENCOUNTER — Ambulatory Visit (INDEPENDENT_AMBULATORY_CARE_PROVIDER_SITE_OTHER): Admitting: Professional

## 2019-12-30 DIAGNOSIS — F411 Generalized anxiety disorder: Secondary | ICD-10-CM

## 2019-12-30 DIAGNOSIS — F331 Major depressive disorder, recurrent, moderate: Secondary | ICD-10-CM | POA: Diagnosis not present

## 2020-01-02 ENCOUNTER — Ambulatory Visit: Admitting: Professional

## 2020-01-08 ENCOUNTER — Ambulatory Visit: Admitting: Professional

## 2020-01-16 ENCOUNTER — Ambulatory Visit (INDEPENDENT_AMBULATORY_CARE_PROVIDER_SITE_OTHER): Admitting: Professional

## 2020-01-16 DIAGNOSIS — F331 Major depressive disorder, recurrent, moderate: Secondary | ICD-10-CM

## 2020-01-16 DIAGNOSIS — F4310 Post-traumatic stress disorder, unspecified: Secondary | ICD-10-CM | POA: Diagnosis not present

## 2020-01-20 ENCOUNTER — Ambulatory Visit: Admitting: Professional

## 2020-01-23 ENCOUNTER — Ambulatory Visit (INDEPENDENT_AMBULATORY_CARE_PROVIDER_SITE_OTHER): Admitting: Professional

## 2020-01-23 DIAGNOSIS — F4310 Post-traumatic stress disorder, unspecified: Secondary | ICD-10-CM | POA: Diagnosis not present

## 2020-01-23 DIAGNOSIS — F331 Major depressive disorder, recurrent, moderate: Secondary | ICD-10-CM

## 2020-01-28 ENCOUNTER — Ambulatory Visit (INDEPENDENT_AMBULATORY_CARE_PROVIDER_SITE_OTHER): Admitting: Professional

## 2020-01-28 DIAGNOSIS — F331 Major depressive disorder, recurrent, moderate: Secondary | ICD-10-CM

## 2020-01-28 DIAGNOSIS — F4310 Post-traumatic stress disorder, unspecified: Secondary | ICD-10-CM | POA: Diagnosis not present

## 2020-01-30 ENCOUNTER — Ambulatory Visit (INDEPENDENT_AMBULATORY_CARE_PROVIDER_SITE_OTHER): Admitting: Professional

## 2020-01-30 DIAGNOSIS — F4310 Post-traumatic stress disorder, unspecified: Secondary | ICD-10-CM | POA: Diagnosis not present

## 2020-01-30 DIAGNOSIS — F331 Major depressive disorder, recurrent, moderate: Secondary | ICD-10-CM | POA: Diagnosis not present

## 2020-01-31 ENCOUNTER — Telehealth (HOSPITAL_COMMUNITY): Payer: Self-pay | Admitting: Psychiatry

## 2020-01-31 NOTE — Telephone Encounter (Signed)
D:  [name], [facility] referred pt to [facility].  A:  Placed call to orient pt.  Pt informed case manager that she has [place].  Informed pt that [facility] nor any of the groups at [facility] accept that insurance.  Discussed PHP at [facility] (had inquired with Randall Hiss and Raquel Sarna (mgrs) and [facility] with pt; but she declined both.  Encouraged pt to reach back out to [facility].  Case manager will inform Juliann Pulse (therapist) also.

## 2020-02-04 ENCOUNTER — Ambulatory Visit (INDEPENDENT_AMBULATORY_CARE_PROVIDER_SITE_OTHER): Admitting: Professional

## 2020-02-04 DIAGNOSIS — F331 Major depressive disorder, recurrent, moderate: Secondary | ICD-10-CM

## 2020-02-04 DIAGNOSIS — F4310 Post-traumatic stress disorder, unspecified: Secondary | ICD-10-CM | POA: Diagnosis not present

## 2020-02-11 ENCOUNTER — Ambulatory Visit (INDEPENDENT_AMBULATORY_CARE_PROVIDER_SITE_OTHER): Admitting: Professional

## 2020-02-11 DIAGNOSIS — F331 Major depressive disorder, recurrent, moderate: Secondary | ICD-10-CM

## 2020-02-11 DIAGNOSIS — F4310 Post-traumatic stress disorder, unspecified: Secondary | ICD-10-CM

## 2020-02-14 ENCOUNTER — Ambulatory Visit: Admitting: Professional

## 2020-02-18 ENCOUNTER — Ambulatory Visit (INDEPENDENT_AMBULATORY_CARE_PROVIDER_SITE_OTHER): Admitting: Professional

## 2020-02-18 DIAGNOSIS — F4310 Post-traumatic stress disorder, unspecified: Secondary | ICD-10-CM

## 2020-02-18 DIAGNOSIS — F331 Major depressive disorder, recurrent, moderate: Secondary | ICD-10-CM

## 2020-03-04 ENCOUNTER — Ambulatory Visit (INDEPENDENT_AMBULATORY_CARE_PROVIDER_SITE_OTHER): Admitting: Professional

## 2020-03-04 DIAGNOSIS — F331 Major depressive disorder, recurrent, moderate: Secondary | ICD-10-CM | POA: Diagnosis not present

## 2020-03-04 DIAGNOSIS — F4310 Post-traumatic stress disorder, unspecified: Secondary | ICD-10-CM | POA: Diagnosis not present

## 2020-03-11 ENCOUNTER — Ambulatory Visit (INDEPENDENT_AMBULATORY_CARE_PROVIDER_SITE_OTHER): Admitting: Professional

## 2020-03-11 DIAGNOSIS — F331 Major depressive disorder, recurrent, moderate: Secondary | ICD-10-CM | POA: Diagnosis not present

## 2020-03-11 DIAGNOSIS — F4310 Post-traumatic stress disorder, unspecified: Secondary | ICD-10-CM | POA: Diagnosis not present

## 2020-03-19 ENCOUNTER — Ambulatory Visit (INDEPENDENT_AMBULATORY_CARE_PROVIDER_SITE_OTHER): Admitting: Professional

## 2020-03-19 DIAGNOSIS — F331 Major depressive disorder, recurrent, moderate: Secondary | ICD-10-CM

## 2020-03-19 DIAGNOSIS — F4310 Post-traumatic stress disorder, unspecified: Secondary | ICD-10-CM | POA: Diagnosis not present

## 2020-03-23 ENCOUNTER — Ambulatory Visit: Admitting: Professional

## 2020-03-26 ENCOUNTER — Ambulatory Visit (INDEPENDENT_AMBULATORY_CARE_PROVIDER_SITE_OTHER): Admitting: Professional

## 2020-03-26 DIAGNOSIS — F331 Major depressive disorder, recurrent, moderate: Secondary | ICD-10-CM | POA: Diagnosis not present

## 2020-03-26 DIAGNOSIS — F4310 Post-traumatic stress disorder, unspecified: Secondary | ICD-10-CM

## 2020-03-27 ENCOUNTER — Ambulatory Visit: Admitting: Professional

## 2020-03-30 ENCOUNTER — Ambulatory Visit: Admitting: Professional

## 2020-04-01 ENCOUNTER — Ambulatory Visit: Admitting: Professional

## 2020-04-07 ENCOUNTER — Ambulatory Visit: Admitting: Professional

## 2020-04-15 ENCOUNTER — Ambulatory Visit (INDEPENDENT_AMBULATORY_CARE_PROVIDER_SITE_OTHER): Admitting: Professional

## 2020-04-15 DIAGNOSIS — F331 Major depressive disorder, recurrent, moderate: Secondary | ICD-10-CM

## 2020-04-15 DIAGNOSIS — F4310 Post-traumatic stress disorder, unspecified: Secondary | ICD-10-CM | POA: Diagnosis not present

## 2020-04-17 ENCOUNTER — Ambulatory Visit (INDEPENDENT_AMBULATORY_CARE_PROVIDER_SITE_OTHER): Admitting: Professional

## 2020-04-17 DIAGNOSIS — F4310 Post-traumatic stress disorder, unspecified: Secondary | ICD-10-CM

## 2020-04-17 DIAGNOSIS — F331 Major depressive disorder, recurrent, moderate: Secondary | ICD-10-CM | POA: Diagnosis not present

## 2020-04-20 ENCOUNTER — Ambulatory Visit (INDEPENDENT_AMBULATORY_CARE_PROVIDER_SITE_OTHER): Admitting: Professional

## 2020-04-20 DIAGNOSIS — F331 Major depressive disorder, recurrent, moderate: Secondary | ICD-10-CM | POA: Diagnosis not present

## 2020-04-20 DIAGNOSIS — F4310 Post-traumatic stress disorder, unspecified: Secondary | ICD-10-CM | POA: Diagnosis not present

## 2020-04-27 ENCOUNTER — Ambulatory Visit: Admitting: Professional

## 2020-05-04 ENCOUNTER — Ambulatory Visit (INDEPENDENT_AMBULATORY_CARE_PROVIDER_SITE_OTHER): Admitting: Professional

## 2020-05-04 DIAGNOSIS — F331 Major depressive disorder, recurrent, moderate: Secondary | ICD-10-CM

## 2020-05-04 DIAGNOSIS — F4310 Post-traumatic stress disorder, unspecified: Secondary | ICD-10-CM

## 2020-05-11 ENCOUNTER — Ambulatory Visit: Admitting: Professional

## 2020-05-18 ENCOUNTER — Ambulatory Visit (INDEPENDENT_AMBULATORY_CARE_PROVIDER_SITE_OTHER): Admitting: Professional

## 2020-05-18 DIAGNOSIS — F4310 Post-traumatic stress disorder, unspecified: Secondary | ICD-10-CM

## 2020-05-18 DIAGNOSIS — F331 Major depressive disorder, recurrent, moderate: Secondary | ICD-10-CM

## 2020-05-25 ENCOUNTER — Ambulatory Visit: Admitting: Professional

## 2020-05-30 ENCOUNTER — Ambulatory Visit (INDEPENDENT_AMBULATORY_CARE_PROVIDER_SITE_OTHER): Admitting: Professional

## 2020-05-30 DIAGNOSIS — F331 Major depressive disorder, recurrent, moderate: Secondary | ICD-10-CM

## 2020-05-30 DIAGNOSIS — F4310 Post-traumatic stress disorder, unspecified: Secondary | ICD-10-CM | POA: Diagnosis not present

## 2020-06-01 ENCOUNTER — Ambulatory Visit: Admitting: Professional

## 2020-06-02 ENCOUNTER — Ambulatory Visit (INDEPENDENT_AMBULATORY_CARE_PROVIDER_SITE_OTHER): Admitting: Professional

## 2020-06-02 DIAGNOSIS — F331 Major depressive disorder, recurrent, moderate: Secondary | ICD-10-CM

## 2020-06-02 DIAGNOSIS — F4310 Post-traumatic stress disorder, unspecified: Secondary | ICD-10-CM

## 2020-06-05 ENCOUNTER — Ambulatory Visit (INDEPENDENT_AMBULATORY_CARE_PROVIDER_SITE_OTHER): Admitting: Professional

## 2020-06-05 DIAGNOSIS — F4310 Post-traumatic stress disorder, unspecified: Secondary | ICD-10-CM | POA: Diagnosis not present

## 2020-06-05 DIAGNOSIS — F331 Major depressive disorder, recurrent, moderate: Secondary | ICD-10-CM | POA: Diagnosis not present

## 2020-06-08 ENCOUNTER — Ambulatory Visit: Admitting: Professional

## 2020-06-12 ENCOUNTER — Ambulatory Visit (INDEPENDENT_AMBULATORY_CARE_PROVIDER_SITE_OTHER): Admitting: Professional

## 2020-06-12 DIAGNOSIS — F331 Major depressive disorder, recurrent, moderate: Secondary | ICD-10-CM | POA: Diagnosis not present

## 2020-06-12 DIAGNOSIS — F4310 Post-traumatic stress disorder, unspecified: Secondary | ICD-10-CM

## 2020-06-15 ENCOUNTER — Ambulatory Visit: Admitting: Professional

## 2020-06-16 ENCOUNTER — Ambulatory Visit: Admitting: Professional

## 2020-06-19 ENCOUNTER — Ambulatory Visit (INDEPENDENT_AMBULATORY_CARE_PROVIDER_SITE_OTHER): Admitting: Professional

## 2020-06-19 DIAGNOSIS — F4310 Post-traumatic stress disorder, unspecified: Secondary | ICD-10-CM | POA: Diagnosis not present

## 2020-06-19 DIAGNOSIS — F331 Major depressive disorder, recurrent, moderate: Secondary | ICD-10-CM | POA: Diagnosis not present

## 2020-06-22 ENCOUNTER — Ambulatory Visit: Admitting: Professional

## 2020-06-23 ENCOUNTER — Ambulatory Visit: Admitting: Professional

## 2020-06-26 ENCOUNTER — Ambulatory Visit: Admitting: Professional

## 2020-06-29 ENCOUNTER — Ambulatory Visit: Admitting: Professional

## 2020-06-30 ENCOUNTER — Ambulatory Visit: Admitting: Professional

## 2020-07-03 ENCOUNTER — Ambulatory Visit: Admitting: Professional

## 2020-07-06 ENCOUNTER — Ambulatory Visit: Admitting: Professional

## 2020-07-07 ENCOUNTER — Ambulatory Visit: Admitting: Professional

## 2020-07-10 ENCOUNTER — Ambulatory Visit (INDEPENDENT_AMBULATORY_CARE_PROVIDER_SITE_OTHER): Admitting: Professional

## 2020-07-10 DIAGNOSIS — F331 Major depressive disorder, recurrent, moderate: Secondary | ICD-10-CM

## 2020-07-14 ENCOUNTER — Ambulatory Visit: Admitting: Professional

## 2020-07-17 ENCOUNTER — Ambulatory Visit: Admitting: Professional

## 2020-07-22 ENCOUNTER — Ambulatory Visit (INDEPENDENT_AMBULATORY_CARE_PROVIDER_SITE_OTHER): Admitting: Professional

## 2020-07-22 DIAGNOSIS — F4310 Post-traumatic stress disorder, unspecified: Secondary | ICD-10-CM

## 2020-07-22 DIAGNOSIS — F331 Major depressive disorder, recurrent, moderate: Secondary | ICD-10-CM

## 2020-07-31 ENCOUNTER — Ambulatory Visit: Admitting: Professional

## 2020-08-01 ENCOUNTER — Ambulatory Visit (INDEPENDENT_AMBULATORY_CARE_PROVIDER_SITE_OTHER): Admitting: Professional

## 2020-08-01 DIAGNOSIS — F4310 Post-traumatic stress disorder, unspecified: Secondary | ICD-10-CM

## 2020-08-01 DIAGNOSIS — F331 Major depressive disorder, recurrent, moderate: Secondary | ICD-10-CM | POA: Diagnosis not present

## 2020-08-14 ENCOUNTER — Ambulatory Visit: Admitting: Professional

## 2020-08-18 ENCOUNTER — Ambulatory Visit: Admitting: Professional

## 2020-08-21 ENCOUNTER — Ambulatory Visit: Admitting: Professional

## 2020-08-28 ENCOUNTER — Ambulatory Visit: Admitting: Professional

## 2020-09-04 ENCOUNTER — Ambulatory Visit: Admitting: Professional

## 2020-09-05 ENCOUNTER — Ambulatory Visit: Admitting: Professional

## 2020-09-07 ENCOUNTER — Ambulatory Visit (INDEPENDENT_AMBULATORY_CARE_PROVIDER_SITE_OTHER): Admitting: Professional

## 2020-09-07 DIAGNOSIS — F331 Major depressive disorder, recurrent, moderate: Secondary | ICD-10-CM | POA: Diagnosis not present

## 2020-09-07 DIAGNOSIS — F4310 Post-traumatic stress disorder, unspecified: Secondary | ICD-10-CM

## 2020-09-11 ENCOUNTER — Ambulatory Visit: Admitting: Professional

## 2020-09-12 ENCOUNTER — Ambulatory Visit: Admitting: Professional

## 2020-09-18 ENCOUNTER — Ambulatory Visit (INDEPENDENT_AMBULATORY_CARE_PROVIDER_SITE_OTHER): Admitting: Professional

## 2020-09-18 DIAGNOSIS — F4310 Post-traumatic stress disorder, unspecified: Secondary | ICD-10-CM

## 2020-09-18 DIAGNOSIS — F331 Major depressive disorder, recurrent, moderate: Secondary | ICD-10-CM | POA: Diagnosis not present

## 2020-09-22 ENCOUNTER — Ambulatory Visit (INDEPENDENT_AMBULATORY_CARE_PROVIDER_SITE_OTHER): Admitting: Professional

## 2020-09-22 DIAGNOSIS — F4310 Post-traumatic stress disorder, unspecified: Secondary | ICD-10-CM

## 2020-09-22 DIAGNOSIS — F331 Major depressive disorder, recurrent, moderate: Secondary | ICD-10-CM | POA: Diagnosis not present

## 2020-09-25 ENCOUNTER — Ambulatory Visit: Admitting: Professional

## 2020-10-02 ENCOUNTER — Ambulatory Visit (INDEPENDENT_AMBULATORY_CARE_PROVIDER_SITE_OTHER): Admitting: Professional

## 2020-10-02 DIAGNOSIS — F331 Major depressive disorder, recurrent, moderate: Secondary | ICD-10-CM

## 2020-10-02 DIAGNOSIS — F4310 Post-traumatic stress disorder, unspecified: Secondary | ICD-10-CM

## 2020-10-05 ENCOUNTER — Ambulatory Visit (INDEPENDENT_AMBULATORY_CARE_PROVIDER_SITE_OTHER): Admitting: Professional

## 2020-10-05 DIAGNOSIS — F331 Major depressive disorder, recurrent, moderate: Secondary | ICD-10-CM | POA: Diagnosis not present

## 2020-10-05 DIAGNOSIS — F4310 Post-traumatic stress disorder, unspecified: Secondary | ICD-10-CM | POA: Diagnosis not present

## 2020-10-16 ENCOUNTER — Ambulatory Visit (INDEPENDENT_AMBULATORY_CARE_PROVIDER_SITE_OTHER): Admitting: Professional

## 2020-10-16 DIAGNOSIS — F331 Major depressive disorder, recurrent, moderate: Secondary | ICD-10-CM

## 2020-10-16 DIAGNOSIS — F4310 Post-traumatic stress disorder, unspecified: Secondary | ICD-10-CM | POA: Diagnosis not present

## 2020-10-23 ENCOUNTER — Ambulatory Visit: Admitting: Professional

## 2020-10-30 ENCOUNTER — Ambulatory Visit (INDEPENDENT_AMBULATORY_CARE_PROVIDER_SITE_OTHER): Admitting: Professional

## 2020-10-30 DIAGNOSIS — F331 Major depressive disorder, recurrent, moderate: Secondary | ICD-10-CM | POA: Diagnosis not present

## 2020-10-30 DIAGNOSIS — F4310 Post-traumatic stress disorder, unspecified: Secondary | ICD-10-CM | POA: Diagnosis not present

## 2020-11-06 ENCOUNTER — Ambulatory Visit (INDEPENDENT_AMBULATORY_CARE_PROVIDER_SITE_OTHER): Admitting: Professional

## 2020-11-06 DIAGNOSIS — F331 Major depressive disorder, recurrent, moderate: Secondary | ICD-10-CM | POA: Diagnosis not present

## 2020-11-06 DIAGNOSIS — F4310 Post-traumatic stress disorder, unspecified: Secondary | ICD-10-CM

## 2020-11-10 ENCOUNTER — Ambulatory Visit (INDEPENDENT_AMBULATORY_CARE_PROVIDER_SITE_OTHER): Admitting: Professional

## 2020-11-10 DIAGNOSIS — F331 Major depressive disorder, recurrent, moderate: Secondary | ICD-10-CM

## 2020-11-10 DIAGNOSIS — F4310 Post-traumatic stress disorder, unspecified: Secondary | ICD-10-CM | POA: Diagnosis not present

## 2020-11-13 ENCOUNTER — Ambulatory Visit: Admitting: Professional

## 2020-11-18 ENCOUNTER — Ambulatory Visit (INDEPENDENT_AMBULATORY_CARE_PROVIDER_SITE_OTHER): Admitting: Professional

## 2020-11-18 DIAGNOSIS — F331 Major depressive disorder, recurrent, moderate: Secondary | ICD-10-CM

## 2020-11-18 DIAGNOSIS — F4310 Post-traumatic stress disorder, unspecified: Secondary | ICD-10-CM

## 2020-11-27 ENCOUNTER — Ambulatory Visit: Admitting: Professional

## 2020-12-12 ENCOUNTER — Ambulatory Visit (INDEPENDENT_AMBULATORY_CARE_PROVIDER_SITE_OTHER): Admitting: Professional

## 2020-12-12 DIAGNOSIS — F4310 Post-traumatic stress disorder, unspecified: Secondary | ICD-10-CM | POA: Diagnosis not present

## 2020-12-12 DIAGNOSIS — F331 Major depressive disorder, recurrent, moderate: Secondary | ICD-10-CM

## 2020-12-16 ENCOUNTER — Ambulatory Visit (INDEPENDENT_AMBULATORY_CARE_PROVIDER_SITE_OTHER): Admitting: Professional

## 2020-12-16 DIAGNOSIS — F331 Major depressive disorder, recurrent, moderate: Secondary | ICD-10-CM | POA: Diagnosis not present

## 2020-12-16 DIAGNOSIS — F4310 Post-traumatic stress disorder, unspecified: Secondary | ICD-10-CM

## 2020-12-18 ENCOUNTER — Ambulatory Visit: Admitting: Professional

## 2020-12-23 ENCOUNTER — Ambulatory Visit (INDEPENDENT_AMBULATORY_CARE_PROVIDER_SITE_OTHER): Admitting: Professional

## 2020-12-23 DIAGNOSIS — F331 Major depressive disorder, recurrent, moderate: Secondary | ICD-10-CM

## 2020-12-23 DIAGNOSIS — F4310 Post-traumatic stress disorder, unspecified: Secondary | ICD-10-CM | POA: Diagnosis not present

## 2020-12-25 ENCOUNTER — Ambulatory Visit: Admitting: Professional

## 2020-12-31 ENCOUNTER — Ambulatory Visit (INDEPENDENT_AMBULATORY_CARE_PROVIDER_SITE_OTHER): Admitting: Professional

## 2020-12-31 DIAGNOSIS — F4310 Post-traumatic stress disorder, unspecified: Secondary | ICD-10-CM | POA: Diagnosis not present

## 2020-12-31 DIAGNOSIS — F331 Major depressive disorder, recurrent, moderate: Secondary | ICD-10-CM | POA: Diagnosis not present

## 2021-01-01 ENCOUNTER — Ambulatory Visit: Admitting: Professional

## 2021-01-13 ENCOUNTER — Ambulatory Visit (INDEPENDENT_AMBULATORY_CARE_PROVIDER_SITE_OTHER): Admitting: Professional

## 2021-01-13 DIAGNOSIS — F4310 Post-traumatic stress disorder, unspecified: Secondary | ICD-10-CM | POA: Diagnosis not present

## 2021-01-13 DIAGNOSIS — F331 Major depressive disorder, recurrent, moderate: Secondary | ICD-10-CM

## 2021-01-20 ENCOUNTER — Ambulatory Visit (INDEPENDENT_AMBULATORY_CARE_PROVIDER_SITE_OTHER): Admitting: Professional

## 2021-01-20 DIAGNOSIS — F4310 Post-traumatic stress disorder, unspecified: Secondary | ICD-10-CM

## 2021-01-20 DIAGNOSIS — F331 Major depressive disorder, recurrent, moderate: Secondary | ICD-10-CM

## 2021-09-14 ENCOUNTER — Other Ambulatory Visit: Payer: Self-pay | Admitting: Otolaryngology

## 2021-09-29 NOTE — Progress Notes (Signed)
Attempted to go over pre-op instructions for her surgery on Fri 6/23 with Dr. Wilburn Cornelia, but the pt states she needs to cancel the procedure. Pt advised to call Dr. Wilburn Cornelia first thing in the am to inform him.

## 2021-09-30 NOTE — Progress Notes (Signed)
Pt still appearing on the surgery schedule. Called pt back to confirm that she will not be coming in for surgery. The pt states that she did call the office this am and informed the front desk and left a message for the scheduler to cancel the procedure.

## 2021-10-01 ENCOUNTER — Ambulatory Visit (HOSPITAL_COMMUNITY): Admission: RE | Admit: 2021-10-01 | Source: Ambulatory Visit | Admitting: Otolaryngology

## 2021-10-01 ENCOUNTER — Encounter (HOSPITAL_COMMUNITY): Payer: Self-pay

## 2021-10-01 SURGERY — SEPTOPLASTY, NOSE, WITH NASAL TURBINATE REDUCTION
Anesthesia: General | Laterality: Bilateral

## 2021-11-02 ENCOUNTER — Ambulatory Visit: Admitting: Professional

## 2021-11-05 ENCOUNTER — Encounter: Payer: Self-pay | Admitting: Professional

## 2021-11-05 ENCOUNTER — Ambulatory Visit (INDEPENDENT_AMBULATORY_CARE_PROVIDER_SITE_OTHER): Admitting: Professional

## 2021-11-05 DIAGNOSIS — F411 Generalized anxiety disorder: Secondary | ICD-10-CM | POA: Diagnosis not present

## 2021-11-05 DIAGNOSIS — F331 Major depressive disorder, recurrent, moderate: Secondary | ICD-10-CM | POA: Diagnosis not present
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
## 2021-11-05 NOTE — Progress Notes (Signed)
° ° ° ° ° ° ° ° ° ° ° ° ° ° °  Mikayla Chiusano, LCMHC °

## 2021-11-15 ENCOUNTER — Encounter: Payer: Self-pay | Admitting: Professional

## 2021-11-15 ENCOUNTER — Ambulatory Visit (INDEPENDENT_AMBULATORY_CARE_PROVIDER_SITE_OTHER): Admitting: Professional

## 2021-11-15 DIAGNOSIS — F331 Major depressive disorder, recurrent, moderate: Secondary | ICD-10-CM

## 2021-11-15 DIAGNOSIS — F411 Generalized anxiety disorder: Secondary | ICD-10-CM | POA: Diagnosis not present

## 2021-11-15 NOTE — Progress Notes (Signed)
° ° ° ° ° ° ° ° ° ° ° ° ° ° °  Loa Idler, LCMHC °

## 2021-11-15 NOTE — Progress Notes (Signed)
[place] Counselor/Therapist Progress Note  Patient ID: [name], MRN: [id number],    Date: 11/15/2021  Time Spent: 53 minutes [id number]   Treatment Type: Individual Therapy  Risk Assessment: Danger to Self:  No Self-injurious Behavior: No Danger to Others: No  Subjective:  This session was held via video teletherapy. The patient consented to video teletherapy and was located at her home during this session. She is aware it is the responsibility of the patient to secure confidentiality on her end of the session. The provider was in a private home office for the duration of this session.   The patient arrived late for her webex session.  Issues addressed: 1-health -the patient completed the tapering from her Cymbalta. She is jittery and struggling with her mood. The mood swings are really bad and her heart rate has been elevated quite a bit. -pt reports she feels fed up with the world, especially Doren Custard. She is done being positive and admits that she feels stuck in the relationship due to the financial issues. -pt talked to her sister-in-law [name] this past week and had a good conversation. [name] discussed spirituality with her and she suggested spending some time dealing with spirituality. She mailed her a gift of the Daphne and pt thinks she will invest some time. 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 -she has been working with a lady more experienced and a great team player   -pt will have to get through until January as her coworker is out caring for a sick  relative -pt excited for the school to open so she can leave her home to get away from [facility] -has been in therapy -has had an increase in anxiety -he does nothing and is unhappy -his friend got kicked out of school but Rodman Key did not want to change schools -he will be a senior this year  Treatment Plan Problems Addressed  Anxiety, Childhood Trauma, Intimate Relationship Conflicts, Unipolar Depression Goals 1. Alleviate depressive symptoms and return to previous level of effective functioning. 2. Appropriately grieve the loss in order to normalize mood and to return to previously adaptive level of functioning. Objective Increasingly verbalize hopeful and positive statements regarding self, others, and the future. Target Date: 2022-11-15 Frequency: Biweekly  Progress: 0 Modality: individual  Objective Learn and implement behavioral strategies to overcome depression. Target Date: 2022-11-15 Frequency: Biweekly  Progress: 0 Modality: individual  Related Interventions Assist the client in developing skills that increase the likelihood of deriving pleasure from behavioral activation (e.g., assertiveness skills, developing an exercise plan, less internal/more external focus, increased social involvement); reinforce success. Objective Verbalize an understanding of healthy and unhealthy emotions with the intent of increasing the use of healthy emotions to guide actions. Target Date: 2022-11-15 Frequency: Biweekly  Progress: 0 Modality: individual  Objective Learn and implement relapse prevention skills. Target Date: 2022-11-15 Frequency: Biweekly  Progress: 0 Modality: individual  Related Interventions Discuss with the client the distinction between a lapse and relapse, associating a lapse with a rather common, temporary setback that may involve, for example, re-experiencing a depressive thought and/or urge  to withdraw or avoid (perhaps as related to some loss or conflict) and a  relapse as a sustained return to a pattern of depressive thinking and feeling usually accompanied by interpersonal withdrawal and/or avoidance. Identify and rehearse with the client the management of future situations or circumstances in which lapses could occur. 3. Develop healthy thinking patterns and beliefs about self, others, and the world that lead to the alleviation and help prevent the relapse of depression. 4. Enhance ability to effectively cope with the full variety of life's worries and anxieties. 5. Increase awareness of own role in the relationship conflicts. Objective Identify problems and strengths in the relationship, including one's own role in each. Target Date: 2022-11-15 Frequency: Biweekly  Progress: 0 Modality: individual  Related Interventions Assess current, ongoing problems in the relationship, including possible abuse/neglect, substance use, communication, conflict resolution, as well as home environment (if domestic violence is present, plan for safety and avoid early use of conjoint sessions; see the Physical Abuse chapter in The Couples Psychotherapy Treatment Planner by Maudie Mercury, and Jongsma). Assess strengths in the relationship that could be enhanced during the therapy to facilitate the accomplishment of therapeutic goals. Objective Learn and implement problem-solving and conflict resolution skills. Target Date: 2022-11-15 Frequency: Biweekly  Progress: 0 Modality: individual  Related Interventions Review how newly learned communication skills can be applied to conflict resolution through calm, respectful, effective dialogue; role-play application of this skill to a present conflict situation. Objective Accept partner's existing characteristics that are unlikely to change but do not jeopardize the relationship. Target Date: 2022-11-15 Frequency: Biweekly  Progress: 0 Modality: individual  Objective Understand the origin of each other's negative emotions  and reactions and develop more constructive interactions that fill needs. Target Date: 2022-11-15 Frequency: Biweekly  Progress: 0 Modality: individual  Related Interventions Assist the patient in developing more constructive interactions that satisfy attachment needs such as increased intimacy and expressions of love (or assign "How Can We Meet Each Other's Needs and Desires?" in the Adult Psychotherapy Homework Planner by [facility]). Encourage the patient to recognize, reframe, and express these insecurities toward resolving negative emotional and behavioral reactions. Objective Implement a "time out" signal that either partner may give to stop interaction that may escalate into abuse. Target Date: 2022-11-15 Frequency: Biweekly  Progress: 0 Modality: individual  Related Interventions Request patient discuss an agreement to use a "time out" signal and that it will be responded to favorably without debate. Assist the patient in identifying a clear verbal or behavioral signal to be used by either partner to terminate interaction immediately if either fears impending abuse. Objective Increase time spent in enjoyable contact with the partner. Target Date: 2022-11-15 Frequency: Biweekly  Progress: 0 Modality: individual  Related Interventions Assist the patient in identifying and planning rewarding social/recreational activities that can be shared with the partner (or assign "Identify and Schedule Pleasant Activities" in the Adult Psychotherapy Homework Planner by Bryn Gulling). 6. Learn and implement coping skills that result in a reduction of anxiety and worry, and improved daily functioning. Objective Identify and engage in pleasant activities on a daily basis. Target Date: 2022-11-15 Frequency: Biweekly  Progress: 0 Modality: individual  Related Interventions Engage the client in behavioral activation, increasing the client's contact with sources of reward, identifying processes that inhibit  activation, and teaching skills to solve life problems (or assign "Identify and Schedule Pleasant Activities" in the Adult Psychotherapy Homework Planner by Bryn Gulling); use behavioral techniques such as instruction, rehearsal, role-playing, role reversal as needed to assist adoption into  the client's daily life; reinforce success. Objective Identify, challenge, and replace biased, fearful self-talk with positive, realistic, and empowering self-talk. Target Date: 2022-11-15 Frequency: Biweekly  Progress: 0 Modality: individual  Objective Learn and implement calming skills to reduce overall anxiety and manage anxiety symptoms. Target Date: 2022-11-15 Frequency: Biweekly  Progress: 0 Modality: individual  Related Interventions Teach the client calming/relaxation skills (e.g., applied relaxation, progressive muscle relaxation, cue controlled relaxation; mindful breathing; biofeedback) and how to discriminate better between relaxation and tension; teach the client how to apply these skills to his/her daily life (e.g., New Directions in Progressive Muscle Relaxation by Casper Harrison, and Hazlett-Stevens; Treating Generalized Anxiety Disorder by Rygh and Amparo Bristol). Objective Learn and implement problem-solving strategies for realistically addressing worries. Target Date: 2022-11-15 Frequency: Biweekly  Progress: 0 Modality: individual  Related Interventions Teach the client problem-solving strategies involving specifically defining a problem, generating options for addressing it, evaluating the pros and cons of each option, selecting and implementing an optional action, and reevaluating and refining the action (or assign "Applying Problem-Solving to Interpersonal Conflict" in the Adult Psychotherapy Homework Planner by Bryn Gulling). 7. Learn to identify escalating behaviors that lead to abuse. 8. Let go of blame and begin to forgive others for pain caused in childhood. Objective Decrease statements of  being a victim while increasing statements that reflect personal empowerment. Target Date: 2022-11-15 Frequency: Biweekly  Progress: 0 Modality: individual  Related Interventions Encourage and reinforce the client's statements that reflect movement away from viewing self as a victim and toward personal empowerment as a survivor (or assign "Changing from Victim to Survivor" in the Adult Psychotherapy Homework Planner by Bryn Gulling). Ask the client to complete an exercise that identifies the positives and negatives of being a victim and the positives and negatives of being a survivor; compare and process the lists. Objective Describe what it was like to grow up in the home environment. Target Date: 2022-11-15 Frequency: Biweekly  Progress: 0 Modality: individual  Objective Identify the positive aspects for self of being able to forgive all those involved with the abuse. Target Date: 2022-11-15 Frequency: Biweekly  Progress: 0 Modality: individual  Related Interventions Teach the client the benefits (i.e., release of hurt and anger, putting issue in the past, opens door for trust of others, etc.) of beginning a process of forgiveness of (not necessarily forgetting or fraternizing with) abusive adults. Objective Increase level of trust of others as shown by more socialization and greater intimacy tolerance. Target Date: 2022-11-15 Frequency: Biweekly  Progress: 0 Modality: individual  Related Interventions Teach the client the advantages of treating people as trustworthy given a reasonable amount of time to assess their character. Teach the client the share-check method of building trust in relationships (sharing a little information and checking as to the recipient's sensitivity in reacting to that information).  Diagnosis:Major depressive disorder, recurrent episode, moderate (HCC)  Generalized anxiety disorder  Plan:  -will meet biweekly at patient's request -next session will be on Monday,  November 29, 2021 at [place], [facility]

## 2021-11-19 LAB — HM COLONOSCOPY

## 2021-11-29 ENCOUNTER — Encounter: Payer: Self-pay | Admitting: Professional

## 2021-11-29 ENCOUNTER — Ambulatory Visit (INDEPENDENT_AMBULATORY_CARE_PROVIDER_SITE_OTHER): Admitting: Professional

## 2021-11-29 DIAGNOSIS — F411 Generalized anxiety disorder: Secondary | ICD-10-CM

## 2021-11-29 DIAGNOSIS — F331 Major depressive disorder, recurrent, moderate: Secondary | ICD-10-CM

## 2021-11-29 NOTE — Progress Notes (Signed)
[place] Counselor/Therapist Progress Note  Patient ID: [name], MRN: [id number],    Date: 11/29/2021  Time Spent: 59 minutes [id number]   Treatment Type: Individual Therapy  Risk Assessment: Danger to Self:  No Self-injurious Behavior: No Danger to Others: No  Subjective:  This session was held via video teletherapy. The patient consented to video teletherapy and was located at her home during this session. She is aware it is the responsibility of the patient to secure confidentiality on her end of the session. The provider was in a private home office for the duration of this session.   The patient arrived late for her webex session.  Issues addressed: 1-health -she was very discouraged when she expressed concerns to her physician who attributed all of her complaints to weight gain -pt is considering joining the [employer] while trying to break down a gate for the trash when the pt told him to set it out on the street -pt is concerned that his judgment is getting worse -pt spoke with him on Saturday and he said that they need to sell the house and go their separate ways   -pt agreed   -pt feels uncertain of how she would manage     -advised pt to consider writing down different options for her future occupationally and financially so that she can begin researching the answers to fulfill her needs 3-professional -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 Alleviate depressive symptoms and return to previous  level of effective functioning. 2. Appropriately grieve the loss in order to normalize mood and to return to previously adaptive level of functioning. Objective Increasingly verbalize hopeful and positive statements regarding self, others, and the future. Target Date: 2022-11-15 Frequency: Biweekly  Progress: 0 Modality: individual  Objective Learn and implement behavioral strategies to overcome depression. Target Date: 2022-11-15 Frequency: Biweekly  Progress: 0 Modality: individual  Related Interventions Assist the client in developing skills that increase the likelihood of deriving pleasure from behavioral activation (e.g., assertiveness skills, developing an exercise plan, less internal/more external focus, increased social involvement); reinforce success. Objective Verbalize an understanding of healthy and unhealthy emotions with the intent of increasing the use of healthy emotions to guide actions. Target Date: 2022-11-15 Frequency: Biweekly  Progress: 0 Modality: individual  Objective Learn and implement relapse prevention skills. Target Date: 2022-11-15 Frequency: Biweekly  Progress: 0 Modality: individual  Related Interventions Discuss with the client the distinction between a lapse and relapse, associating a lapse with a rather common, temporary setback that may involve, for example, re-experiencing a depressive thought and/or urge to withdraw or avoid (perhaps as related to some loss or conflict) and a relapse as a sustained return to a pattern of depressive thinking and feeling usually accompanied by interpersonal withdrawal and/or avoidance. Identify and rehearse with the client the management of future situations or circumstances in which lapses could occur. 3. Develop healthy thinking patterns and beliefs about self, others, and the world that lead to the alleviation and help prevent the relapse of depression. 4. Enhance ability to  effectively cope with the full variety of  life's worries and anxieties. 5. Increase awareness of own role in the relationship conflicts. Objective Identify problems and strengths in the relationship, including one's own role in each. Target Date: 2022-11-15 Frequency: Biweekly  Progress: 0 Modality: individual  Related Interventions Assess current, ongoing problems in the relationship, including possible abuse/neglect, substance use, communication, conflict resolution, as well as home environment (if domestic violence is present, plan for safety and avoid early use of conjoint sessions; see the Physical Abuse chapter in The Couples Psychotherapy Treatment Planner by Maudie Mercury, and Jongsma). Assess strengths in the relationship that could be enhanced during the therapy to facilitate the accomplishment of therapeutic goals. Objective Learn and implement problem-solving and conflict resolution skills. Target Date: 2022-11-15 Frequency: Biweekly  Progress: 0 Modality: individual  Related Interventions Review how newly learned communication skills can be applied to conflict resolution through calm, respectful, effective dialogue; role-play application of this skill to a present conflict situation. Objective Accept partner's existing characteristics that are unlikely to change but do not jeopardize the relationship. Target Date: 2022-11-15 Frequency: Biweekly  Progress: 0 Modality: individual  Objective Understand the origin of each other's negative emotions and reactions and develop more constructive interactions that fill needs. Target Date: 2022-11-15 Frequency: Biweekly  Progress: 0 Modality: individual  Related Interventions Assist the patient in developing more constructive interactions that satisfy attachment needs such as increased intimacy and expressions of love (or assign "How Can We Meet Each Other's Needs and Desires?" in the Adult Psychotherapy Homework Planner by [facility]). Encourage the patient to recognize, reframe,  and express these insecurities toward resolving negative emotional and behavioral reactions. Objective Implement a "time out" signal that either partner may give to stop interaction that may escalate into abuse. Target Date: 2022-11-15 Frequency: Biweekly  Progress: 0 Modality: individual  Related Interventions Request patient discuss an agreement to use a "time out" signal and that it will be responded to favorably without debate. Assist the patient in identifying a clear verbal or behavioral signal to be used by either partner to terminate interaction immediately if either fears impending abuse. Objective Increase time spent in enjoyable contact with the partner. Target Date: 2022-11-15 Frequency: Biweekly  Progress: 0 Modality: individual  Related Interventions Assist the patient in identifying and planning rewarding social/recreational activities that can be shared with the partner (or assign "Identify and Schedule Pleasant Activities" in the Adult Psychotherapy Homework Planner by Bryn Gulling). 6. Learn and implement coping skills that result in a reduction of anxiety and worry, and improved daily functioning. Objective Identify and engage in pleasant activities on a daily basis. Target Date: 2022-11-15 Frequency: Biweekly  Progress: 0 Modality: individual  Related Interventions Engage the client in behavioral activation, increasing the client's contact with sources of reward, identifying processes that inhibit activation, and teaching skills to solve life problems (or assign "Identify and Schedule Pleasant Activities" in the Adult Psychotherapy Homework Planner by Jongsma); use behavioral techniques such as instruction, rehearsal, role-playing, role reversal as needed to assist adoption into the client's daily life; reinforce success. Objective Identify, challenge, and replace biased, fearful self-talk with positive, realistic, and empowering self-talk. Target Date: 2022-11-15 Frequency:  Biweekly  Progress: 0 Modality: individual  Objective Learn and implement calming skills to reduce overall anxiety and manage anxiety symptoms. Target Date: 2022-11-15 Frequency: Biweekly  Progress: 0 Modality: individual  Related Interventions Teach the client calming/relaxation skills (e.g., applied relaxation, progressive muscle relaxation, cue controlled relaxation; mindful breathing; biofeedback) and how to discriminate better between  relaxation and tension; teach the client how to apply these skills to his/her daily life (e.g., New Directions in Progressive Muscle Relaxation by Casper Harrison, and Hazlett-Stevens; Treating Generalized Anxiety Disorder by Rygh and Amparo Bristol). Objective Learn and implement problem-solving strategies for realistically addressing worries. Target Date: 2022-11-15 Frequency: Biweekly  Progress: 0 Modality: individual  Related Interventions Teach the client problem-solving strategies involving specifically defining a problem, generating options for addressing it, evaluating the pros and cons of each option, selecting and implementing an optional action, and reevaluating and refining the action (or assign "Applying Problem-Solving to Interpersonal Conflict" in the Adult Psychotherapy Homework Planner by Bryn Gulling). 7. Learn to identify escalating behaviors that lead to abuse. 8. Let go of blame and begin to forgive others for pain caused in childhood. Objective Decrease statements of being a victim while increasing statements that reflect personal empowerment. Target Date: 2022-11-15 Frequency: Biweekly  Progress: 0 Modality: individual  Related Interventions Encourage and reinforce the client's statements that reflect movement away from viewing self as a victim and toward personal empowerment as a survivor (or assign "Changing from Victim to Survivor" in the Adult Psychotherapy Homework Planner by Bryn Gulling). Ask the client to complete an exercise that  identifies the positives and negatives of being a victim and the positives and negatives of being a survivor; compare and process the lists. Objective Describe what it was like to grow up in the home environment. Target Date: 2022-11-15 Frequency: Biweekly  Progress: 0 Modality: individual  Objective Identify the positive aspects for self of being able to forgive all those involved with the abuse. Target Date: 2022-11-15 Frequency: Biweekly  Progress: 0 Modality: individual  Related Interventions Teach the client the benefits (i.e., release of hurt and anger, putting issue in the past, opens door for trust of others, etc.) of beginning a process of forgiveness of (not necessarily forgetting or fraternizing with) abusive adults. Objective Increase level of trust of others as shown by more socialization and greater intimacy tolerance. Target Date: 2022-11-15 Frequency: Biweekly  Progress: 0 Modality: individual  Related Interventions Teach the client the advantages of treating people as trustworthy given a reasonable amount of time to assess their character. Teach the client the share-check method of building trust in relationships (sharing a little information and checking as to the recipient's sensitivity in reacting to that information).  Diagnosis:Major depressive disorder, recurrent episode, moderate (HCC)  Generalized anxiety disorder  Plan:  -next session will be on Thursday, December 16, 2021 at [place], [facility]

## 2021-12-16 ENCOUNTER — Encounter: Payer: Self-pay | Admitting: Professional

## 2021-12-16 ENCOUNTER — Ambulatory Visit (INDEPENDENT_AMBULATORY_CARE_PROVIDER_SITE_OTHER): Admitting: Professional

## 2021-12-16 DIAGNOSIS — F411 Generalized anxiety disorder: Secondary | ICD-10-CM

## 2021-12-16 DIAGNOSIS — F331 Major depressive disorder, recurrent, moderate: Secondary | ICD-10-CM

## 2021-12-16 NOTE — Progress Notes (Signed)
[facility] Counselor/Therapist Progress Note  Patient ID: [name], MRN: [id number],    Date: 12/16/2021  Time Spent: 68 minutes [id number]   Treatment Type: Individual Therapy  Risk Assessment: Danger to Self:  No Self-injurious Behavior: No Danger to Others: No  Subjective:  This session was held via video teletherapy. The patient consented to video teletherapy and was located at her home during this session. She is aware it is the responsibility of the patient to secure confidentiality on her end of the session. The provider was in a private home office for the duration of this session.   The patient arrived late for her webex session due to a faculty meeting.  Issues addressed: 1-mental health -pt thinks she needs to get back on medication -Heather, a preschool coordinator triggers the patient   -she is a bully and reminds pt of her father and brother   -she has bullied her before -pt admits with her history of being bullied it makes her emotional and angry 2-marital -Doren Custard is not going to go to [place] called the [place] and will get a call back in the next month   -to discuss family therapy -pt always goes to bed and gets up before him -they sleep in twin beds -they are civil to each other but not positive   -"I do not have positive feelings when I talk to him" -early in their relationship what pt appreciated   -"I don't remember" -he no longer pressures her for sex   -"he leaves me alone" -Doren Custard is still wasting his time talking about work issues   -she has told him to stop talking to Rodman Key   -she told him he has been verbally abusing them for the past year since his employment woes began -she has told him she is in a wheelchair and cannot adequately examine a pt   -pt feels mean but has tried to talk with him about disability   -he is so selfish that he cannot see that it is time to step away   -she does not like being blunt or hurting  him but she is trying to get him to accept -he denies that he says things and makes the pt so angry -pt thinks he's acting like a dry drunk and has told him she doesn't trust him or believe anything that comes out of his mouth 3-professional -schedule is full and over-flowing -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 -she then will have paperwork time -there are two preschool coordinators, one that is excellent Jinny Blossom) and the other Nira Conn) wants to do things her way   -pt and other faculty have issues with Nira Conn as well     -she had not worked in the school system and wants people to change to her way   -Nira Conn expects that the pt will go outside to pick the children up from the car when they are coming in as an outside student   -if the issues with Nira Conn continue the pt will go to the [place] -pt worked really hard to reverse the Covid habit of picking the child up from and returning the child to the car 4-Matthew -he is no longer attending [place] -he is online and it is too easy and he is not liking it -they bought curriculum   -needed [place] [employer])   -work study and vocabulary   -[place]   -[place] -he  is not good at working independently   -her husband is there and she told him he will do his part     -he did not respond -if Rodman Key does not get his work done he will be registered for [employer] Plan Problems Addressed  Anxiety, Childhood Trauma, Intimate Relationship Conflicts, Unipolar Depression Goals 1. Alleviate depressive symptoms and return to previous level of effective functioning. 2. Appropriately grieve the loss in order to normalize mood and to return to previously adaptive level of functioning. Objective Increasingly verbalize hopeful and positive statements regarding self, others, and the future. Target Date: 2022-11-15 Frequency: Biweekly  Progress: 0  Modality: individual  Objective Learn and implement behavioral strategies to overcome depression. Target Date: 2022-11-15 Frequency: Biweekly  Progress: 0 Modality: individual  Related Interventions Assist the client in developing skills that increase the likelihood of deriving pleasure from behavioral activation (e.g., assertiveness skills, developing an exercise plan, less internal/more external focus, increased social involvement); reinforce success. Objective Verbalize an understanding of healthy and unhealthy emotions with the intent of increasing the use of healthy emotions to guide actions. Target Date: 2022-11-15 Frequency: Biweekly  Progress: 0 Modality: individual  Objective Learn and implement relapse prevention skills. Target Date: 2022-11-15 Frequency: Biweekly  Progress: 0 Modality: individual  Related Interventions Discuss with the client the distinction between a lapse and relapse, associating a lapse with a rather common, temporary setback that may involve, for example, re-experiencing a depressive thought and/or urge to withdraw or avoid (perhaps as related to some loss or conflict) and a relapse as a sustained return to a pattern of depressive thinking and feeling usually accompanied by interpersonal withdrawal and/or avoidance. Identify and rehearse with the client the management of future situations or circumstances in which lapses could occur. 3. Develop healthy thinking patterns and beliefs about self, others, and the world that lead to the alleviation and help prevent the relapse of depression. 4. Enhance ability to effectively cope with the full variety of life's worries and anxieties. 5. Increase awareness of own role in the relationship conflicts. Objective Identify problems and strengths in the relationship, including one's own role in each. Target Date: 2022-11-15 Frequency: Biweekly  Progress: 0 Modality: individual  Related Interventions Assess current,  ongoing problems in the relationship, including possible abuse/neglect, substance use, communication, conflict resolution, as well as home environment (if domestic violence is present, plan for safety and avoid early use of conjoint sessions; see the Physical Abuse chapter in The Couples Psychotherapy Treatment Planner by Maudie Mercury, and Jongsma). Assess strengths in the relationship that could be enhanced during the therapy to facilitate the accomplishment of therapeutic goals. Objective Learn and implement problem-solving and conflict resolution skills. Target Date: 2022-11-15 Frequency: Biweekly  Progress: 0 Modality: individual  Related Interventions Review how newly learned communication skills can be applied to conflict resolution through calm, respectful, effective dialogue; role-play application of this skill to a present conflict situation. Objective Accept partner's existing characteristics that are unlikely to change but do not jeopardize the relationship. Target Date: 2022-11-15 Frequency: Biweekly  Progress: 0 Modality: individual  Objective Understand the origin of each other's negative emotions and reactions and develop more constructive interactions that fill needs. Target Date: 2022-11-15 Frequency: Biweekly  Progress: 0 Modality: individual  Related Interventions Assist the patient in developing more constructive interactions that satisfy attachment needs such as increased intimacy and expressions of love (or assign "How Can We Meet Each Other's Needs and Desires?" in the Adult Psychotherapy Homework Planner by [facility]).  Encourage the patient to recognize, reframe, and express these insecurities toward resolving negative emotional and behavioral reactions. Objective Implement a "time out" signal that either partner may give to stop interaction that may escalate into abuse. Target Date: 2022-11-15 Frequency: Biweekly  Progress: 0 Modality: individual  Related  Interventions Request patient discuss an agreement to use a "time out" signal and that it will be responded to favorably without debate. Assist the patient in identifying a clear verbal or behavioral signal to be used by either partner to terminate interaction immediately if either fears impending abuse. Objective Increase time spent in enjoyable contact with the partner. Target Date: 2022-11-15 Frequency: Biweekly  Progress: 0 Modality: individual  Related Interventions Assist the patient in identifying and planning rewarding social/recreational activities that can be shared with the partner (or assign "Identify and Schedule Pleasant Activities" in the Adult Psychotherapy Homework Planner by Bryn Gulling). 6. Learn and implement coping skills that result in a reduction of anxiety and worry, and improved daily functioning. Objective Identify and engage in pleasant activities on a daily basis. Target Date: 2022-11-15 Frequency: Biweekly  Progress: 0 Modality: individual  Related Interventions Engage the client in behavioral activation, increasing the client's contact with sources of reward, identifying processes that inhibit activation, and teaching skills to solve life problems (or assign "Identify and Schedule Pleasant Activities" in the Adult Psychotherapy Homework Planner by Jongsma); use behavioral techniques such as instruction, rehearsal, role-playing, role reversal as needed to assist adoption into the client's daily life; reinforce success. Objective Identify, challenge, and replace biased, fearful self-talk with positive, realistic, and empowering self-talk. Target Date: 2022-11-15 Frequency: Biweekly  Progress: 0 Modality: individual  Objective Learn and implement calming skills to reduce overall anxiety and manage anxiety symptoms. Target Date: 2022-11-15 Frequency: Biweekly  Progress: 0 Modality: individual  Related Interventions Teach the client calming/relaxation skills (e.g.,  applied relaxation, progressive muscle relaxation, cue controlled relaxation; mindful breathing; biofeedback) and how to discriminate better between relaxation and tension; teach the client how to apply these skills to his/her daily life (e.g., New Directions in Progressive Muscle Relaxation by Casper Harrison, and Hazlett-Stevens; Treating Generalized Anxiety Disorder by Rygh and Amparo Bristol). Objective Learn and implement problem-solving strategies for realistically addressing worries. Target Date: 2022-11-15 Frequency: Biweekly  Progress: 0 Modality: individual  Related Interventions Teach the client problem-solving strategies involving specifically defining a problem, generating options for addressing it, evaluating the pros and cons of each option, selecting and implementing an optional action, and reevaluating and refining the action (or assign "Applying Problem-Solving to Interpersonal Conflict" in the Adult Psychotherapy Homework Planner by Bryn Gulling). 7. Learn to identify escalating behaviors that lead to abuse. 8. Let go of blame and begin to forgive others for pain caused in childhood. Objective Decrease statements of being a victim while increasing statements that reflect personal empowerment. Target Date: 2022-11-15 Frequency: Biweekly  Progress: 0 Modality: individual  Related Interventions Encourage and reinforce the client's statements that reflect movement away from viewing self as a victim and toward personal empowerment as a survivor (or assign "Changing from Victim to Survivor" in the Adult Psychotherapy Homework Planner by Bryn Gulling). Ask the client to complete an exercise that identifies the positives and negatives of being a victim and the positives and negatives of being a survivor; compare and process the lists. Objective Describe what it was like to grow up in the home environment. Target Date: 2022-11-15 Frequency: Biweekly  Progress: 0 Modality: individual   Objective Identify the positive aspects for self of being able to forgive  all those involved with the abuse. Target Date: 2022-11-15 Frequency: Biweekly  Progress: 0 Modality: individual  Related Interventions Teach the client the benefits (i.e., release of hurt and anger, putting issue in the past, opens door for trust of others, etc.) of beginning a process of forgiveness of (not necessarily forgetting or fraternizing with) abusive adults. Objective Increase level of trust of others as shown by more socialization and greater intimacy tolerance. Target Date: 2022-11-15 Frequency: Biweekly  Progress: 0 Modality: individual  Related Interventions Teach the client the advantages of treating people as trustworthy given a reasonable amount of time to assess their character. Teach the client the share-check method of building trust in relationships (sharing a little information and checking as to the recipient's sensitivity in reacting to that information).  Diagnosis:Major depressive disorder, recurrent episode, moderate (HCC)  Generalized anxiety disorder  Plan:  -go out to engage self-care -next session will be on Thursday, December 30, 2021 at [place], [facility]

## 2021-12-30 ENCOUNTER — Ambulatory Visit: Admitting: Professional

## 2022-01-11 ENCOUNTER — Other Ambulatory Visit: Payer: Self-pay

## 2022-01-11 ENCOUNTER — Emergency Department (HOSPITAL_BASED_OUTPATIENT_CLINIC_OR_DEPARTMENT_OTHER)

## 2022-01-11 ENCOUNTER — Emergency Department (HOSPITAL_BASED_OUTPATIENT_CLINIC_OR_DEPARTMENT_OTHER): Admitting: Radiology

## 2022-01-11 ENCOUNTER — Emergency Department (HOSPITAL_BASED_OUTPATIENT_CLINIC_OR_DEPARTMENT_OTHER)
Admission: EM | Admit: 2022-01-11 | Discharge: 2022-01-11 | Disposition: A | Discharge status: Home / Self Care | Attending: Emergency Medicine | Admitting: Emergency Medicine

## 2022-01-11 ENCOUNTER — Encounter (HOSPITAL_BASED_OUTPATIENT_CLINIC_OR_DEPARTMENT_OTHER): Payer: Self-pay

## 2022-01-11 DIAGNOSIS — M25512 Pain in left shoulder: Secondary | ICD-10-CM | POA: Insufficient documentation

## 2022-01-11 DIAGNOSIS — S0003XA Contusion of scalp, initial encounter: Secondary | ICD-10-CM

## 2022-01-11 DIAGNOSIS — W182XXA Fall in (into) shower or empty bathtub, initial encounter: Secondary | ICD-10-CM | POA: Insufficient documentation

## 2022-01-11 DIAGNOSIS — Z91040 Latex allergy status: Secondary | ICD-10-CM | POA: Insufficient documentation

## 2022-01-11 DIAGNOSIS — M79674 Pain in right toe(s): Secondary | ICD-10-CM | POA: Diagnosis not present

## 2022-01-11 DIAGNOSIS — M25511 Pain in right shoulder: Secondary | ICD-10-CM | POA: Diagnosis not present

## 2022-01-11 DIAGNOSIS — R55 Syncope and collapse: Secondary | ICD-10-CM

## 2022-01-11 DIAGNOSIS — Y92002 Bathroom of unspecified non-institutional (private) residence single-family (private) house as the place of occurrence of the external cause: Secondary | ICD-10-CM | POA: Diagnosis not present

## 2022-01-11 DIAGNOSIS — M542 Cervicalgia: Secondary | ICD-10-CM

## 2022-01-11 DIAGNOSIS — S8011XA Contusion of right lower leg, initial encounter: Secondary | ICD-10-CM | POA: Diagnosis not present

## 2022-01-11 DIAGNOSIS — S0990XA Unspecified injury of head, initial encounter: Secondary | ICD-10-CM | POA: Diagnosis present

## 2022-01-11 LAB — BASIC METABOLIC PANEL
Anion gap: 13 (ref 5–15)
BUN: 23 mg/dL — ABNORMAL HIGH (ref 6–20)
CO2: 26 mmol/L (ref 22–32)
Calcium: 9.6 mg/dL (ref 8.9–10.3)
Chloride: 104 mmol/L (ref 98–111)
Creatinine, Ser: 0.89 mg/dL (ref 0.44–1.00)
GFR, Estimated: >60 mL/min (ref >60)
Glucose, Bld: 169 mg/dL — ABNORMAL HIGH (ref 70–99)
Potassium: 3.7 mmol/L (ref 3.5–5.1)
Sodium: 143 mmol/L (ref 135–145)

## 2022-01-11 LAB — CBC
HCT: 41.9 % (ref 36.0–46.0)
Hemoglobin: 14 g/dL (ref 12.0–15.0)
MCH: 31.1 pg (ref 26.0–34.0)
MCHC: 33.4 g/dL (ref 30.0–36.0)
MCV: 93.1 fL (ref 80.0–100.0)
Platelets: 183 10*3/uL (ref 150–400)
RBC: 4.5 MIL/uL (ref 3.87–5.11)
RDW: 13.2 % (ref 11.5–15.5)
WBC: 7.1 10*3/uL (ref 4.0–10.5)
nRBC: 0 % (ref 0.0–0.2)

## 2022-01-11 MED ORDER — IBUPROFEN 800 MG PO TABS
800.0000 mg | ORAL_TABLET | Freq: Once | ORAL | Status: AC
  Administered 2022-01-11: 800 mg via ORAL
  Filled 2022-01-11: qty 1

## 2022-01-11 NOTE — ED Triage Notes (Signed)
Pt states she fell after getting out of a hot bath last night . Pt states she passed out on tile floor. Pt states she was unconscious for a few minutes, unsure of time. Pt states left shoulder pain that radiates to her right shoulder, as well as neck pain that radiates down to her ribs. Pt also c/o jammed right great toe and right knee pain.  Pt states she hit her head on right side as well.

## 2022-01-11 NOTE — ED Notes (Signed)
PA reports pt became irate and yelling at her because we did not have MRI. Another RN went in to discharge the patient. She was very rude and angry to her. I went in as I had been this patients primary nurse. Pt had voiced no concerns during our interactions. Pt now cursing and yelling at me stating this is a "joke of an ER. There is no MRI and the PA did not assess me properly". I explained to the patient that an MRI is not an emergent exam and in the ED we rule out emergent conditions. I reminded the patient that she had multiple CT's and Xrays performed. Pt is still unhappy and jerked the paperwork out of my hand and stormed out of the room.

## 2022-01-11 NOTE — ED Notes (Signed)
Inserted PIV with ultrasound in right forearm without difficulty.  Great blood return and labs drawn.  With insertion patient demanding to have PIV removed stating it hurts.  PIV removed on patients request.  She states does not want an IV.  MD informed

## 2022-01-11 NOTE — ED Provider Notes (Signed)
[facility] Provider Note   CSN: [id number] Arrival date & time: 01/11/22  8841     History  Chief Complaint  Patient presents with   Loss of Consciousness    [name] is a 51 y.o. female.  Pt reports she passed out last pm after getting out of a hot bath.  Pt reports she hit her head.  Pt believes she lost consciousness before she hit the floor.  Pt complains of pain in multiple areas.  Pt complains of pain in her head, her neck.  Pt reports she hit on her right shoulder and has pain in her left shoulder as well.  Pt reports pain in her left shoulder is worse than right.  Pt reports she bruised her right leg.  Pt has pain at bruised area and up to the back of her right knee Pt complains of pain in her right 1st toe.    The history is provided by the patient. No language interpreter was used.  Loss of Consciousness Episode history:  Single Most recent episode:  Yesterday Timing:  Constant Progression:  Worsening Chronicity:  New Witnessed: yes   Relieved by:  Nothing Ineffective treatments:  None tried Associated symptoms: recent fall and recent injury   Associated symptoms: no fever and no vomiting   Risk factors: no coronary artery disease and no seizures        Home Medications Prior to Admission medications   Medication Sig Start Date End Date Taking? Authorizing Provider  acetaminophen (TYLENOL) 500 MG tablet Take 1,000 mg by mouth every 6 (six) hours as needed for moderate pain.    [provider]  ALPRAZolam Duanne Moron) 0.25 MG tablet Take 1 tablet (0.25 mg total) by mouth daily as needed for anxiety. 1/2 - 2 tabs by mouth twice per day as needed Patient taking differently: Take 0.125 mg by mouth daily as needed for anxiety. 05/22/15   Hoyt Koch, MD  atenolol (TENORMIN) 50 MG tablet Take 1 tablet (50 mg total) by mouth daily. Need office visit for further refills Patient taking differently: Take 50 mg by mouth at  bedtime. Need office visit for further refills 12/15/16   Hoyt Koch, MD  cetirizine (ZYRTEC) 10 MG tablet Take 10 mg by mouth daily as needed for allergies.    [provider]  diclofenac Sodium (VOLTAREN) 1 % GEL Apply 1 Application topically 2 (two) times daily as needed (pain).    [provider]  levothyroxine (SYNTHROID) 150 MCG tablet Take 150 mcg by mouth daily. 08/26/21   [provider]  pantoprazole (PROTONIX) 40 MG tablet Take 40 mg by mouth 2 (two) times daily. 06/12/21   [provider]  sodium chloride (OCEAN) 0.65 % SOLN nasal spray Place 1 spray into both nostrils as needed for congestion.    [provider]  SUMAtriptan (IMITREX) 100 MG tablet Take 100 mg by mouth every 2 (two) hours as needed for migraine. 06/02/21   [provider]  topiramate (TOPAMAX) 50 MG tablet Take 50 mg by mouth at bedtime. 08/26/21   [provider]  Vitamin D, Ergocalciferol, (DRISDOL) 1.25 MG (50000 UNIT) CAPS capsule Take 50,000 Units by mouth every Saturday. 06/02/21   [provider]      Allergies    Other, Doxycycline, Erythromycin, Keflex [cephalexin], Latex, Penicillins, and Sulfa antibiotics    Review of Systems   Review of Systems  Constitutional:  Negative for fever.  Cardiovascular:  Positive for syncope.  Gastrointestinal:  Negative for vomiting.  Musculoskeletal:  Positive for joint swelling.  Neurological:  Positive for syncope.    Physical Exam Updated Vital Signs BP (!) 147/86   Pulse (!) 49   Temp 98.2 F (36.8 C)   Resp 16   Ht '5\' 3"'$  (1.6 m)   Wt 104.3 kg   SpO2 98%   BMI 40.74 kg/m  Physical Exam Vitals reviewed.  HENT:     Head: Normocephalic and atraumatic.     Comments: Tender right lateral scalp, no obvious bruising     Mouth/Throat:     Mouth: Mucous membranes are moist.  Eyes:     Extraocular Movements: Extraocular movements intact.     Conjunctiva/sclera: Conjunctivae normal.      Pupils: Pupils are equal, round, and reactive to light.  Cardiovascular:     Rate and Rhythm: Normal rate.  Pulmonary:     Effort: Pulmonary effort is normal.  Musculoskeletal:        General: Tenderness present.     Cervical back: Normal range of motion. Tenderness present.     Comments: Right shoulder pain to touch.  Left shoulder,  pain with range of motion,  pt has decreased abduction and increased pain with movement Tender right knee , 2.5 cm bruised area right upper leg  right 1st toe tender, pain with range of motion nv intact   Neurological:     Mental Status: She is alert.  Psychiatric:        Mood and Affect: Mood normal.     ED Results / Procedures / Treatments   Labs (all labs ordered are listed, but only abnormal results are displayed) Labs Reviewed  BASIC METABOLIC PANEL - Abnormal; Notable for the following components:      Result Value   Glucose, Bld 169 (*)    BUN 23 (*)    All other components within normal limits  CBC    EKG EKG Interpretation  Date/Time:  Tuesday January 11 2022 09:47:12 EDT Ventricular Rate:  59 PR Interval:  149 QRS Duration: 90 QT Interval:  421 QTC Calculation: 417 R Axis:   10 Text Interpretation: Sinus rhythm Low voltage, precordial leads No significant change since prior 10/20 Confirmed by Aletta Edouard [id number]) on 01/11/2022 10:00:03 AM  Radiology CT Head Wo Contrast  Result Date: 01/11/2022 CLINICAL DATA:  Fall after getting out of bathtub last night, neck pain EXAM: CT HEAD WITHOUT CONTRAST CT CERVICAL SPINE WITHOUT CONTRAST TECHNIQUE: Multidetector CT imaging of the head and cervical spine was performed following the standard protocol without intravenous contrast. Multiplanar CT image reconstructions of the cervical spine were also generated. RADIATION DOSE REDUCTION: This exam was performed according to the departmental dose-optimization program which includes automated exposure control, adjustment of the mA and/or kV  according to patient size and/or use of iterative reconstruction technique. COMPARISON:  CT February 09, 2019. FINDINGS: CT HEAD FINDINGS Brain: No evidence of acute infarction, hemorrhage, hydrocephalus, extra-axial collection or mass lesion/mass effect. Vascular: No hyperdense vessel or unexpected calcification. Skull: Normal. Negative for fracture or focal lesion. Sinuses/Orbits: Visualized portions of the paranasal sinuses are predominantly clear. Orbits are grossly unremarkable. Other: None. CT CERVICAL SPINE FINDINGS Alignment: Straightening of the normal cervical lordosis. No evidence of traumatic listhesis Skull base and vertebrae: No acute fracture. No primary bone lesion or focal pathologic process. Small stellate sclerotic focus in the posterior elements of the T12 vertebral body is unchanged from prior and likely reflects a bone island. Soft tissues  and spinal canal: No prevertebral fluid or swelling. No visible canal hematoma. Disc levels: Similar multilevel degenerative changes spine with disc space narrowing, osteophytosis and uncovertebral/facet hypertrophy which produces multilevel neural foraminal and spinal canal narrowing. Upper chest: Negative. Other: None IMPRESSION: 1. No acute intracranial abnormality. 2. No evidence of acute fracture or traumatic listhesis of the cervical spine. 3. Similar multilevel degenerative changes of the cervical spine. Electronically Signed   By: Dahlia Bailiff M.D.   On: 01/11/2022 11:38   CT Cervical Spine Wo Contrast  Result Date: 01/11/2022 CLINICAL DATA:  Fall after getting out of bathtub last night, neck pain EXAM: CT HEAD WITHOUT CONTRAST CT CERVICAL SPINE WITHOUT CONTRAST TECHNIQUE: Multidetector CT imaging of the head and cervical spine was performed following the standard protocol without intravenous contrast. Multiplanar CT image reconstructions of the cervical spine were also generated. RADIATION DOSE REDUCTION: This exam was performed according to the  departmental dose-optimization program which includes automated exposure control, adjustment of the mA and/or kV according to patient size and/or use of iterative reconstruction technique. COMPARISON:  CT February 09, 2019. FINDINGS: CT HEAD FINDINGS Brain: No evidence of acute infarction, hemorrhage, hydrocephalus, extra-axial collection or mass lesion/mass effect. Vascular: No hyperdense vessel or unexpected calcification. Skull: Normal. Negative for fracture or focal lesion. Sinuses/Orbits: Visualized portions of the paranasal sinuses are predominantly clear. Orbits are grossly unremarkable. Other: None. CT CERVICAL SPINE FINDINGS Alignment: Straightening of the normal cervical lordosis. No evidence of traumatic listhesis Skull base and vertebrae: No acute fracture. No primary bone lesion or focal pathologic process. Small stellate sclerotic focus in the posterior elements of the T12 vertebral body is unchanged from prior and likely reflects a bone island. Soft tissues and spinal canal: No prevertebral fluid or swelling. No visible canal hematoma. Disc levels: Similar multilevel degenerative changes spine with disc space narrowing, osteophytosis and uncovertebral/facet hypertrophy which produces multilevel neural foraminal and spinal canal narrowing. Upper chest: Negative. Other: None IMPRESSION: 1. No acute intracranial abnormality. 2. No evidence of acute fracture or traumatic listhesis of the cervical spine. 3. Similar multilevel degenerative changes of the cervical spine. Electronically Signed   By: Dahlia Bailiff M.D.   On: 01/11/2022 11:38   DG Knee Complete 4 Views Right  Result Date: 01/11/2022 CLINICAL DATA:  Fall last night with right knee pain. EXAM: RIGHT KNEE - COMPLETE 4+ VIEW COMPARISON:  None Available. FINDINGS: Minimal osteoarthritic changes present most prominent over the patellofemoral joint. No evidence of acute fracture or dislocation. No significant joint effusion. IMPRESSION: 1. No  acute findings. 2. Minimal osteoarthritis. Electronically Signed   By: Marin Olp M.D.   On: 01/11/2022 11:35   DG Shoulder Right  Result Date: 01/11/2022 CLINICAL DATA:  Fall last night with right shoulder pain. EXAM: RIGHT SHOULDER - 2+ VIEW COMPARISON:  02/09/2019 FINDINGS: Minimal degenerative change of the [facility] joint and glenohumeral joints. No evidence of acute fracture or dislocation. IMPRESSION: 1. No acute findings. 2. Minimal degenerative changes. Electronically Signed   By: Marin Olp M.D.   On: 01/11/2022 11:34   DG Shoulder Left  Result Date: 01/11/2022 CLINICAL DATA:  Fall last night with left shoulder pain. EXAM: LEFT SHOULDER - 2+ VIEW COMPARISON:  None Available. FINDINGS: Minimal degenerative change of the [facility] joint and glenohumeral joints. No evidence of acute fracture or dislocation. IMPRESSION: 1. No acute findings. 2. Minimal degenerative changes. Electronically Signed   By: Marin Olp M.D.   On: 01/11/2022 11:33   DG Foot Complete Right  Result Date: 01/11/2022 CLINICAL DATA:  Fall last night with left foot pain. EXAM: RIGHT FOOT COMPLETE - 3+ VIEW COMPARISON:  05/25/2018 FINDINGS: Examination demonstrates mild degenerative change over the midfoot. Small inferior calcaneal spur. No acute fracture or dislocation. IMPRESSION: No acute findings. Electronically Signed   By: Marin Olp M.D.   On: 01/11/2022 11:32    Procedures Procedures    Medications Ordered in ED Medications  ibuprofen (ADVIL) tablet 800 mg (800 mg Oral Given 01/11/22 1130)    ED Course/ Medical Decision Making/ A&P                           Medical Decision Making Pt had a syncopal episode last pm after getting out of a tub.    Amount and/or Complexity of Data Reviewed Independent Historian: spouse    Details: Pt here with spouse.  He reports pt was unconscious  External Data Reviewed: notes.    Details: Primary care notes reviewed.  Pt had yearly exam in August  Labs: ordered.  Decision-making details documented in ED Course.    Details: Pt has an elevated glucose of 169. (Pt reports prediabetes)  Radiology: ordered and independent interpretation performed. Decision-making details documented in ED Course. ECG/medicine tests: ordered and independent interpretation performed. Decision-making details documented in ED Course.    Details: EKG  no acute cahnges  Discussion of management or test interpretation with external provider(s): Ct head  no acute intracranial abnormality.  Cervical spine shows degenerative changes  no acute abnormality, Xray  right knee and right foot  no fracture Xray bilat shoulders  no acute fracture  Risk OTC drugs. Risk Details: No acute cause for syncope.  Most likely second to vasodilation from hot tub.  Pt has heart rae in the 50's.  She in on a betablocker.  Pt reports she has been discussing medications for BP with her primary care.  Pt advised of results.   Pt request MRi of her shoulder.  I advised her to follow up with her primary care.  Pt reports she sees Dr. Veverly Fells of [facility] and will see him.  Pt upset that she can not get MRI here.  (AD in to speak with pt)            Final Clinical Impression(s) / ED Diagnoses Final diagnoses:  Syncope, unspecified syncope type  Contusion of scalp, initial encounter  Acute pain of both shoulders  Contusion of right lower extremity, initial encounter  Pain of right great toe  Neck pain    Rx / DC Orders ED Discharge Orders     None      An After Visit Summary was printed and given to the patient.    Fransico Meadow, [place] 01/11/22 1336    Hayden Rasmussen, MD 01/11/22 [phone]

## 2022-01-11 NOTE — Discharge Instructions (Addendum)
Return if any problems.

## 2022-01-11 NOTE — ED Notes (Signed)
Pt unable to provide urine sample at this time 

## 2022-01-13 ENCOUNTER — Ambulatory Visit: Admitting: Professional

## 2022-01-27 ENCOUNTER — Encounter: Payer: Self-pay | Admitting: Professional

## 2022-01-27 ENCOUNTER — Ambulatory Visit (INDEPENDENT_AMBULATORY_CARE_PROVIDER_SITE_OTHER): Admitting: Professional

## 2022-01-27 DIAGNOSIS — F331 Major depressive disorder, recurrent, moderate: Secondary | ICD-10-CM

## 2022-01-27 DIAGNOSIS — F411 Generalized anxiety disorder: Secondary | ICD-10-CM

## 2022-01-27 NOTE — Progress Notes (Signed)
[facility] Counselor/Therapist Progress Note  Patient ID: [name], MRN: [id number],    Date: 01/27/2022  Time Spent: 57 minutes [id number]   Treatment Type: Individual Therapy  Risk Assessment: Danger to Self:  No Self-injurious Behavior: No Danger to Others: No  Subjective:  This session was held via video teletherapy. The patient consented to video teletherapy and was located at her office during this session. She is aware it is the responsibility of the patient to secure confidentiality on her end of the session. The provider was in a private home office for the duration of this session.   The patient arrived on time for her webex session.  Issues addressed: 1-mental health -depressed -feels hopeless at times -thinks she needs to be back  on medication and needs a provider 2-physical -pt fell at her home in the bathroom -suffered a shoulder injury and a bump on her head   -still struggling with pain 3-marital -most days they are hanging in there -if they have a negative interaction they just do not talk the rest of the day -[facility] -Doren Custard has been approved for SSDI and it appears it will be back until July 2023 -they have decided that he will retire -he is hopeful to work after his SSDI comes through on a limited basis -he is not well and has to have two kidney stones removed, he has injured his finger and will have surgery for that as well 4-home -pt found an attorney that is willing to take a look at their claims of issues with the contractor -pt has told Doren Custard to get all the pictures and emails together and to call to get estimates   -he has only called two and then lost the info 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 Alleviate depressive symptoms and return to previous level of effective functioning. 2. Appropriately grieve the loss in order to normalize mood and to return to previously adaptive level of functioning. Objective Increasingly verbalize hopeful and positive statements regarding self, others, and the future. Target Date: 2022-11-15 Frequency: Biweekly  Progress: 0 Modality: individual  Objective Learn and implement behavioral strategies to overcome depression. Target Date: 2022-11-15 Frequency: Biweekly  Progress: 0 Modality: individual  Related Interventions Assist the client in developing skills that increase the likelihood of deriving pleasure from behavioral activation (e.g., assertiveness skills, developing an exercise plan, less internal/more external focus, increased social involvement); reinforce success. Objective Verbalize an understanding of healthy and unhealthy emotions with the intent of increasing the use of healthy emotions to guide actions. Target Date: 2022-11-15 Frequency: Biweekly  Progress: 0 Modality: individual  Objective Learn and implement relapse prevention skills. Target Date: 2022-11-15 Frequency: Biweekly  Progress: 0 Modality: individual  Related Interventions Discuss with the client the distinction between a lapse and relapse, associating a lapse with a rather common, temporary setback that may involve, for example, re-experiencing a depressive thought and/or urge to withdraw or avoid (perhaps as related to some loss or conflict) and a relapse as a sustained return to a pattern of  depressive thinking and feeling usually accompanied by interpersonal withdrawal and/or avoidance. Identify and rehearse with the client the management of future  situations or circumstances in which lapses could occur. 3. Develop healthy thinking patterns and beliefs about self, others, and the world that lead to the alleviation and help prevent the relapse of depression. 4. Enhance ability to effectively cope with the full variety of life's worries and anxieties. 5. Increase awareness of own role in the relationship conflicts. Objective Identify problems and strengths in the relationship, including one's own role in each. Target Date: 2022-11-15 Frequency: Biweekly  Progress: 0 Modality: individual  Related Interventions Assess current, ongoing problems in the relationship, including possible abuse/neglect, substance use, communication, conflict resolution, as well as home environment (if domestic violence is present, plan for safety and avoid early use of conjoint sessions; see the Physical Abuse chapter in The Couples Psychotherapy Treatment Planner by Maudie Mercury, and Jongsma). Assess strengths in the relationship that could be enhanced during the therapy to facilitate the accomplishment of therapeutic goals. Objective Learn and implement problem-solving and conflict resolution skills. Target Date: 2022-11-15 Frequency: Biweekly  Progress: 0 Modality: individual  Related Interventions Review how newly learned communication skills can be applied to conflict resolution through calm, respectful, effective dialogue; role-play application of this skill to a present conflict situation. Objective Accept partner's existing characteristics that are unlikely to change but do not jeopardize the relationship. Target Date: 2022-11-15 Frequency: Biweekly  Progress: 0 Modality: individual  Objective Understand the origin of each other's negative emotions and reactions and develop more constructive interactions that fill needs. Target Date: 2022-11-15 Frequency: Biweekly  Progress: 0 Modality: individual  Related Interventions Assist the patient in  developing more constructive interactions that satisfy attachment needs such as increased intimacy and expressions of love (or assign "How Can We Meet Each Other's Needs and Desires?" in the Adult Psychotherapy Homework Planner by [facility]). Encourage the patient to recognize, reframe, and express these insecurities toward resolving negative emotional and behavioral reactions. Objective Implement a "time out" signal that either partner may give to stop interaction that may escalate into abuse. Target Date: 2022-11-15 Frequency: Biweekly  Progress: 0 Modality: individual  Related Interventions Request patient discuss an agreement to use a "time out" signal and that it will be responded to favorably without debate. Assist the patient in identifying a clear verbal or behavioral signal to be used by either partner to terminate interaction immediately if either fears impending abuse. Objective Increase time spent in enjoyable contact with the partner. Target Date: 2022-11-15 Frequency: Biweekly  Progress: 0 Modality: individual  Related Interventions Assist the patient in identifying and planning rewarding social/recreational activities that can be shared with the partner (or assign "Identify and Schedule Pleasant Activities" in the Adult Psychotherapy Homework Planner by Bryn Gulling). 6. Learn and implement coping skills that result in a reduction of anxiety and worry, and improved daily functioning. Objective Identify and engage in pleasant activities on a daily basis. Target Date: 2022-11-15 Frequency: Biweekly  Progress: 0 Modality: individual  Related Interventions Engage the client in behavioral activation, increasing the client's contact with sources of reward, identifying processes that inhibit activation, and teaching skills to solve life problems (or assign "Identify and Schedule Pleasant Activities" in the Adult Psychotherapy Homework Planner by Jongsma); use behavioral techniques such as  instruction, rehearsal, role-playing, role reversal as needed to assist adoption into the client's daily life; reinforce success. Objective Identify, challenge, and replace biased, fearful self-talk with positive, realistic, and empowering self-talk. Target Date: 2022-11-15  Frequency: Biweekly  Progress: 0 Modality: individual  Objective Learn and implement calming skills to reduce overall anxiety and manage anxiety symptoms. Target Date: 2022-11-15 Frequency: Biweekly  Progress: 0 Modality: individual  Related Interventions Teach the client calming/relaxation skills (e.g., applied relaxation, progressive muscle relaxation, cue controlled relaxation; mindful breathing; biofeedback) and how to discriminate better between relaxation and tension; teach the client how to apply these skills to his/her daily life (e.g., New Directions in Progressive Muscle Relaxation by Casper Harrison, and Hazlett-Stevens; Treating Generalized Anxiety Disorder by Rygh and Amparo Bristol). Objective Learn and implement problem-solving strategies for realistically addressing worries. Target Date: 2022-11-15 Frequency: Biweekly  Progress: 0 Modality: individual  Related Interventions Teach the client problem-solving strategies involving specifically defining a problem, generating options for addressing it, evaluating the pros and cons of each option, selecting and implementing an optional action, and reevaluating and refining the action (or assign "Applying Problem-Solving to Interpersonal Conflict" in the Adult Psychotherapy Homework Planner by Bryn Gulling). 7. Learn to identify escalating behaviors that lead to abuse. 8. Let go of blame and begin to forgive others for pain caused in childhood. Objective Decrease statements of being a victim while increasing statements that reflect personal empowerment. Target Date: 2022-11-15 Frequency: Biweekly  Progress: 0 Modality: individual  Related Interventions Encourage and  reinforce the client's statements that reflect movement away from viewing self as a victim and toward personal empowerment as a survivor (or assign "Changing from Victim to Survivor" in the Adult Psychotherapy Homework Planner by Bryn Gulling). Ask the client to complete an exercise that identifies the positives and negatives of being a victim and the positives and negatives of being a survivor; compare and process the lists. Objective Describe what it was like to grow up in the home environment. Target Date: 2022-11-15 Frequency: Biweekly  Progress: 0 Modality: individual  Objective Identify the positive aspects for self of being able to forgive all those involved with the abuse. Target Date: 2022-11-15 Frequency: Biweekly  Progress: 0 Modality: individual  Related Interventions Teach the client the benefits (i.e., release of hurt and anger, putting issue in the past, opens door for trust of others, etc.) of beginning a process of forgiveness of (not necessarily forgetting or fraternizing with) abusive adults. Objective Increase level of trust of others as shown by more socialization and greater intimacy tolerance. Target Date: 2022-11-15 Frequency: Biweekly  Progress: 0 Modality: individual  Related Interventions Teach the client the advantages of treating people as trustworthy given a reasonable amount of time to assess their character. Teach the client the share-check method of building trust in relationships (sharing a little information and checking as to the recipient's sensitivity in reacting to that information).  Diagnosis:Major depressive disorder, recurrent episode, moderate ([facility])  Generalized anxiety disorder  Plan:  -schedule psychiatry appointment. -next session will be on Thursday, February 10, 2022 at [place]

## 2022-02-10 ENCOUNTER — Ambulatory Visit: Admitting: Professional

## 2022-02-24 ENCOUNTER — Ambulatory Visit: Admitting: Professional

## 2022-03-01 ENCOUNTER — Encounter: Payer: Self-pay | Admitting: Professional

## 2022-03-01 ENCOUNTER — Ambulatory Visit (INDEPENDENT_AMBULATORY_CARE_PROVIDER_SITE_OTHER): Admitting: Professional

## 2022-03-01 DIAGNOSIS — F411 Generalized anxiety disorder: Secondary | ICD-10-CM | POA: Diagnosis not present

## 2022-03-01 DIAGNOSIS — F331 Major depressive disorder, recurrent, moderate: Secondary | ICD-10-CM

## 2022-03-01 NOTE — Progress Notes (Signed)
[facility] Counselor/Therapist Progress Note  Patient ID: [name], MRN: [id number],    Date: 03/01/2022  Time Spent: 49 minutes [id number]   Treatment Type: Individual Therapy  Risk Assessment: Danger to Self:  No Self-injurious Behavior: No Danger to Others: No  Subjective:  This session was held via video teletherapy. The patient consented to video teletherapy and was located at her office during this session. She is aware it is the responsibility of the patient to secure confidentiality on her end of the session. The provider was in a private home office for the duration of this session.   The patient arrived late for her webex session.  Issues addressed: 1-mental health -emotionally exhausted 2-physical -pt had Covid last week and is very fatigued -her shoulder pain is somewhat better   -she is using a non-drowsy muscle relaxer 3-marital -Doren Custard developed an infection and had septic shock 4-family -was supposed to be at [employer] last week with niece [place] -had to make all the changes for [place] could use the [employer] 5-professional a-  Treatment Plan Problems Addressed  Anxiety, Childhood Trauma, Intimate Relationship Conflicts, Unipolar Depression Goals 1. Alleviate depressive symptoms and return to previous level of effective functioning. 2. Appropriately grieve the loss in order to normalize mood and to return to previously adaptive level of functioning. Objective Increasingly verbalize hopeful and positive statements regarding self, others, and the future. Target Date: 2022-11-15 Frequency: Biweekly  Progress: 0 Modality: individual  Objective Learn and implement behavioral strategies to overcome depression. Target Date: 2022-11-15 Frequency: Biweekly  Progress: 0 Modality: individual  Related Interventions Assist the client in developing skills that increase the likelihood of deriving pleasure from behavioral activation (e.g.,  assertiveness skills, developing an exercise plan, less internal/more external focus, increased social involvement); reinforce success. Objective Verbalize an understanding of healthy and unhealthy emotions with the intent of increasing the use of healthy emotions to guide actions. Target Date: 2022-11-15 Frequency: Biweekly  Progress: 0 Modality: individual  Objective Learn and implement relapse prevention skills. Target Date: 2022-11-15 Frequency: Biweekly  Progress: 0 Modality: individual  Related Interventions Discuss with the client the distinction between a lapse and relapse, associating a lapse with a rather common, temporary setback that may involve, for example, re-experiencing a depressive thought and/or urge to withdraw or avoid (perhaps as related to some loss or conflict) and a relapse as a sustained return to a pattern of depressive thinking and feeling usually accompanied by interpersonal withdrawal and/or avoidance. Identify and rehearse with the client the management of future situations or circumstances in which lapses could occur. 3. Develop healthy thinking patterns and beliefs about self, others, and the world that lead to the alleviation and help prevent the relapse of depression. 4. Enhance ability to effectively cope with the full variety of life's worries and anxieties. 5. Increase awareness of own role in the relationship conflicts. Objective Identify problems and strengths in the relationship, including one's own role in each. Target Date: 2022-11-15 Frequency: Biweekly  Progress: 0 Modality: individual  Related Interventions Assess current, ongoing problems in the relationship, including possible abuse/neglect, substance use, communication, conflict resolution, as well as home environment (if domestic violence is present, plan for safety and avoid early use of conjoint sessions; see the Physical Abuse chapter in The Couples Psychotherapy Treatment Planner by Maudie Mercury, and Jongsma). Assess strengths in the relationship that could be enhanced during the therapy to facilitate the accomplishment of therapeutic goals. Objective Learn and implement problem-solving and conflict resolution skills. Target  Date: 2022-11-15 Frequency: Biweekly  Progress: 0 Modality: individual  Related Interventions Review how newly learned communication skills can be applied to conflict resolution through calm, respectful, effective dialogue; role-play application of this skill to a present conflict situation. Objective Accept partner's existing characteristics that are unlikely to change but do not jeopardize the relationship. Target Date: 2022-11-15 Frequency: Biweekly  Progress: 0 Modality: individual  Objective Understand the origin of each other's negative emotions and reactions and develop more constructive interactions that fill needs. Target Date: 2022-11-15 Frequency: Biweekly  Progress: 0 Modality: individual  Related Interventions Assist the patient in developing more constructive interactions that satisfy attachment needs such as increased intimacy and expressions of love (or assign "How Can We Meet Each Other's Needs and Desires?" in the Adult Psychotherapy Homework Planner by [facility]). Encourage the patient to recognize, reframe, and express these insecurities toward resolving negative emotional and behavioral reactions. Objective Implement a "time out" signal that either partner may give to stop interaction that may escalate into abuse. Target Date: 2022-11-15 Frequency: Biweekly  Progress: 0 Modality: individual  Related Interventions Request patient discuss an agreement to use a "time out" signal and that it will be responded to favorably without debate. Assist the patient in identifying a clear verbal or behavioral signal to be used by either partner to terminate interaction immediately if either fears impending abuse. Objective Increase time spent in  enjoyable contact with the partner. Target Date: 2022-11-15 Frequency: Biweekly  Progress: 0 Modality: individual  Related Interventions Assist the patient in identifying and planning rewarding social/recreational activities that can be shared with the partner (or assign "Identify and Schedule Pleasant Activities" in the Adult Psychotherapy Homework Planner by Bryn Gulling). 6. Learn and implement coping skills that result in a reduction of anxiety and worry, and improved daily functioning. Objective Identify and engage in pleasant activities on a daily basis. Target Date: 2022-11-15 Frequency: Biweekly  Progress: 0 Modality: individual  Related Interventions Engage the client in behavioral activation, increasing the client's contact with sources of reward, identifying processes that inhibit activation, and teaching skills to solve life problems (or assign "Identify and Schedule Pleasant Activities" in the Adult Psychotherapy Homework Planner by Jongsma); use behavioral techniques such as instruction, rehearsal, role-playing, role reversal as needed to assist adoption into the client's daily life; reinforce success. Objective Identify, challenge, and replace biased, fearful self-talk with positive, realistic, and empowering self-talk. Target Date: 2022-11-15 Frequency: Biweekly  Progress: 0 Modality: individual  Objective Learn and implement calming skills to reduce overall anxiety and manage anxiety symptoms. Target Date: 2022-11-15 Frequency: Biweekly  Progress: 0 Modality: individual  Related Interventions Teach the client calming/relaxation skills (e.g., applied relaxation, progressive muscle relaxation, cue controlled relaxation; mindful breathing; biofeedback) and how to discriminate better between relaxation and tension; teach the client how to apply these skills to his/her daily life (e.g., New Directions in Progressive Muscle Relaxation by Casper Harrison, and Hazlett-Stevens; Treating  Generalized Anxiety Disorder by Rygh and Amparo Bristol). Objective Learn and implement problem-solving strategies for realistically addressing worries. Target Date: 2022-11-15 Frequency: Biweekly  Progress: 0 Modality: individual  Related Interventions Teach the client problem-solving strategies involving specifically defining a problem, generating options for addressing it, evaluating the pros and cons of each option, selecting and implementing an optional action, and reevaluating and refining the action (or assign "Applying Problem-Solving to Interpersonal Conflict" in the Adult Psychotherapy Homework Planner by Bryn Gulling). 7. Learn to identify escalating behaviors that lead to abuse. 8. Let go of blame and begin to forgive others for  pain caused in childhood. Objective Decrease statements of being a victim while increasing statements that reflect personal empowerment. Target Date: 2022-11-15 Frequency: Biweekly  Progress: 0 Modality: individual  Related Interventions Encourage and reinforce the client's statements that reflect movement away from viewing self as a victim and toward personal empowerment as a survivor (or assign "Changing from Victim to Survivor" in the Adult Psychotherapy Homework Planner by Bryn Gulling). Ask the client to complete an exercise that identifies the positives and negatives of being a victim and the positives and negatives of being a survivor; compare and process the lists. Objective Describe what it was like to grow up in the home environment. Target Date: 2022-11-15 Frequency: Biweekly  Progress: 0 Modality: individual  Objective Identify the positive aspects for self of being able to forgive all those involved with the abuse. Target Date: 2022-11-15 Frequency: Biweekly  Progress: 0 Modality: individual  Related Interventions Teach the client the benefits (i.e., release of hurt and anger, putting issue in the past, opens door for trust of others, etc.) of beginning a  process of forgiveness of (not necessarily forgetting or fraternizing with) abusive adults. Objective Increase level of trust of others as shown by more socialization and greater intimacy tolerance. Target Date: 2022-11-15 Frequency: Biweekly  Progress: 0 Modality: individual  Related Interventions Teach the client the advantages of treating people as trustworthy given a reasonable amount of time to assess their character. Teach the client the share-check method of building trust in relationships (sharing a little information and checking as to the recipient's sensitivity in reacting to that information).  Diagnosis:Major depressive disorder, recurrent episode, moderate ([place])  Generalized anxiety disorder  Plan:  -organize documents needed for attorney related to Actor. -next session will be on Thursday, February 10, 2022 at 4pm.

## 2022-03-10 ENCOUNTER — Encounter: Payer: Self-pay | Admitting: Professional

## 2022-03-10 ENCOUNTER — Ambulatory Visit (INDEPENDENT_AMBULATORY_CARE_PROVIDER_SITE_OTHER): Admitting: Professional

## 2022-03-10 DIAGNOSIS — F411 Generalized anxiety disorder: Secondary | ICD-10-CM

## 2022-03-10 DIAGNOSIS — F331 Major depressive disorder, recurrent, moderate: Secondary | ICD-10-CM

## 2022-03-10 NOTE — Progress Notes (Signed)
[facility] Counselor/Therapist Progress Note  Patient ID: [name], MRN: [id number],    Date: 03/10/2022  Time Spent: 12 minutes 410-422pm   Treatment Type: Individual Therapy  Risk Assessment: Danger to Self:  No Self-injurious Behavior: No Danger to Others: No  Subjective:  This session was held via video teletherapy. The patient consented to video teletherapy and was located at her office during this session. She is aware it is the responsibility of the patient to secure confidentiality on her end of the session. The provider was in a private home office for the duration of this session.   The patient arrived late for her webex session.  Issues addressed: 1-homework-completed -organize documents needed for attorney related to Actor. 2-budget -got all passwords and things they need to work on -getting both she and Doren Custard access to all accounts -pt admits that his illness was a huge wake up call for her as far as getting their affairs in order 3-Phillip -pt gets into a very bad place when she talks about him 3-physical/emotional a-Clinician shared that Dr. Madilyn Fireman is willing to accept as pt -pt informed that Anderson Malta will call from our office to schedule the appointment -pt made aware that Dr. Madilyn Fireman will serve as her PCP   Treatment Plan Problems Addressed  Anxiety, Childhood Trauma, Intimate Relationship Conflicts, Unipolar Depression Goals 1. Alleviate depressive symptoms and return to previous level of effective functioning. 2. Appropriately grieve the loss in order to normalize mood and to return to previously adaptive level of functioning. Objective Increasingly verbalize hopeful and positive statements regarding self, others, and the future. Target Date: 2022-11-15 Frequency: Biweekly  Progress: 0 Modality: individual  Objective Learn and implement behavioral strategies to overcome depression. Target Date: 2022-11-15  Frequency: Biweekly  Progress: 0 Modality: individual  Related Interventions Assist the client in developing skills that increase the likelihood of deriving pleasure from behavioral activation (e.g., assertiveness skills, developing an exercise plan, less internal/more external focus, increased social involvement); reinforce success. Objective Verbalize an understanding of healthy and unhealthy emotions with the intent of increasing the use of healthy emotions to guide actions. Target Date: 2022-11-15 Frequency: Biweekly  Progress: 0 Modality: individual  Objective Learn and implement relapse prevention skills. Target Date: 2022-11-15 Frequency: Biweekly  Progress: 0 Modality: individual  Related Interventions Discuss with the client the distinction between a lapse and relapse, associating a lapse with a rather common, temporary setback that may involve, for example, re-experiencing a depressive thought and/or urge to withdraw or avoid (perhaps as related to some loss or conflict) and a relapse as a sustained return to a pattern of depressive thinking and feeling usually accompanied by interpersonal withdrawal and/or avoidance. Identify and rehearse with the client the management of future situations or circumstances in which lapses could occur. 3. Develop healthy thinking patterns and beliefs about self, others, and the world that lead to the alleviation and help prevent the relapse of depression. 4. Enhance ability to effectively cope with the full variety of life's worries and anxieties. 5. Increase awareness of own role in the relationship conflicts. Objective Identify problems and strengths in the relationship, including one's own role in each. Target Date: 2022-11-15 Frequency: Biweekly  Progress: 0 Modality: individual  Related Interventions Assess current, ongoing problems in the relationship, including possible abuse/neglect, substance use, communication, conflict resolution, as well  as home environment (if domestic violence is present, plan for safety and avoid early use of conjoint sessions; see the Physical Abuse chapter in The Couples Psychotherapy  Treatment Planner by Maudie Mercury, and Bryn Gulling). Assess strengths in the relationship that could be enhanced during the therapy to facilitate the accomplishment of therapeutic goals. Objective Learn and implement problem-solving and conflict resolution skills. Target Date: 2022-11-15 Frequency: Biweekly  Progress: 0 Modality: individual  Related Interventions Review how newly learned communication skills can be applied to conflict resolution through calm, respectful, effective dialogue; role-play application of this skill to a present conflict situation. Objective Accept partner's existing characteristics that are unlikely to change but do not jeopardize the relationship. Target Date: 2022-11-15 Frequency: Biweekly  Progress: 0 Modality: individual  Objective Understand the origin of each other's negative emotions and reactions and develop more constructive interactions that fill needs. Target Date: 2022-11-15 Frequency: Biweekly  Progress: 0 Modality: individual  Related Interventions Assist the patient in developing more constructive interactions that satisfy attachment needs such as increased intimacy and expressions of love (or assign "How Can We Meet Each Other's Needs and Desires?" in the Adult Psychotherapy Homework Planner by [facility]). Encourage the patient to recognize, reframe, and express these insecurities toward resolving negative emotional and behavioral reactions. Objective Implement a "time out" signal that either partner may give to stop interaction that may escalate into abuse. Target Date: 2022-11-15 Frequency: Biweekly  Progress: 0 Modality: individual  Related Interventions Request patient discuss an agreement to use a "time out" signal and that it will be responded to favorably without  debate. Assist the patient in identifying a clear verbal or behavioral signal to be used by either partner to terminate interaction immediately if either fears impending abuse. Objective Increase time spent in enjoyable contact with the partner. Target Date: 2022-11-15 Frequency: Biweekly  Progress: 0 Modality: individual  Related Interventions Assist the patient in identifying and planning rewarding social/recreational activities that can be shared with the partner (or assign "Identify and Schedule Pleasant Activities" in the Adult Psychotherapy Homework Planner by Bryn Gulling). 6. Learn and implement coping skills that result in a reduction of anxiety and worry, and improved daily functioning. Objective Identify and engage in pleasant activities on a daily basis. Target Date: 2022-11-15 Frequency: Biweekly  Progress: 0 Modality: individual  Related Interventions Engage the client in behavioral activation, increasing the client's contact with sources of reward, identifying processes that inhibit activation, and teaching skills to solve life problems (or assign "Identify and Schedule Pleasant Activities" in the Adult Psychotherapy Homework Planner by [employer]); use behavioral techniques such as instruction, rehearsal, role-playing, role reversal as needed to assist adoption into the client's daily life; reinforce success. Objective Identify, challenge, and replace biased, fearful self-talk with positive, realistic, and empowering self-talk. Target Date: 2022-11-15 Frequency: Biweekly  Progress: 0 Modality: individual  Objective Learn and implement calming skills to reduce overall anxiety and manage anxiety symptoms. Target Date: 2022-11-15 Frequency: Biweekly  Progress: 0 Modality: individual  Related Interventions Teach the client calming/relaxation skills (e.g., applied relaxation, progressive muscle relaxation, cue controlled relaxation; mindful breathing; biofeedback) and how to discriminate  better between relaxation and tension; teach the client how to apply these skills to his/her daily life (e.g., New Directions in Progressive Muscle Relaxation by Casper Harrison, and Hazlett-Stevens; Treating Generalized Anxiety Disorder by Rygh and Amparo Bristol). Objective Learn and implement problem-solving strategies for realistically addressing worries. Target Date: 2022-11-15 Frequency: Biweekly  Progress: 0 Modality: individual  Related Interventions Teach the client problem-solving strategies involving specifically defining a problem, generating options for addressing it, evaluating the pros and cons of each option, selecting and implementing an optional action, and reevaluating and refining the action (  or assign "Applying Problem-Solving to Interpersonal Conflict" in the Adult Psychotherapy Homework Planner by Bryn Gulling). 7. Learn to identify escalating behaviors that lead to abuse. 8. Let go of blame and begin to forgive others for pain caused in childhood. Objective Decrease statements of being a victim while increasing statements that reflect personal empowerment. Target Date: 2022-11-15 Frequency: Biweekly  Progress: 0 Modality: individual  Related Interventions Encourage and reinforce the client's statements that reflect movement away from viewing self as a victim and toward personal empowerment as a survivor (or assign "Changing from Victim to Survivor" in the Adult Psychotherapy Homework Planner by Bryn Gulling). Ask the client to complete an exercise that identifies the positives and negatives of being a victim and the positives and negatives of being a survivor; compare and process the lists. Objective Describe what it was like to grow up in the home environment. Target Date: 2022-11-15 Frequency: Biweekly  Progress: 0 Modality: individual  Objective Identify the positive aspects for self of being able to forgive all those involved with the abuse. Target Date: 2022-11-15 Frequency:  Biweekly  Progress: 0 Modality: individual  Related Interventions Teach the client the benefits (i.e., release of hurt and anger, putting issue in the past, opens door for trust of others, etc.) of beginning a process of forgiveness of (not necessarily forgetting or fraternizing with) abusive adults. Objective Increase level of trust of others as shown by more socialization and greater intimacy tolerance. Target Date: 2022-11-15 Frequency: Biweekly  Progress: 0 Modality: individual  Related Interventions Teach the client the advantages of treating people as trustworthy given a reasonable amount of time to assess their character. Teach the client the share-check method of building trust in relationships (sharing a little information and checking as to the recipient's sensitivity in reacting to that information).  Diagnosis:Major depressive disorder, recurrent episode, moderate (HCC)  Generalized anxiety disorder  Plan:  -expect call from Primary Care to schedule appt with Dr. Madilyn Fireman who will start her on medication. -next therapy session will be on Thursday, March 24, 2022 at 4pm.

## 2022-03-20 ENCOUNTER — Ambulatory Visit (INDEPENDENT_AMBULATORY_CARE_PROVIDER_SITE_OTHER)

## 2022-03-20 ENCOUNTER — Ambulatory Visit
Admission: RE | Admit: 2022-03-20 | Discharge: 2022-03-20 | Disposition: A | Discharge status: Home / Self Care | Source: Ambulatory Visit | Attending: Family Medicine | Admitting: Family Medicine

## 2022-03-20 VITALS — BP 140/85 | HR 57 | Temp 99.6°F | Resp 18 | Ht 63.0 in | Wt 220.0 lb

## 2022-03-20 DIAGNOSIS — Z8616 Personal history of COVID-19: Secondary | ICD-10-CM | POA: Diagnosis not present

## 2022-03-20 DIAGNOSIS — R052 Subacute cough: Secondary | ICD-10-CM | POA: Diagnosis not present

## 2022-03-20 DIAGNOSIS — R059 Cough, unspecified: Secondary | ICD-10-CM | POA: Diagnosis not present

## 2022-03-20 DIAGNOSIS — J029 Acute pharyngitis, unspecified: Secondary | ICD-10-CM | POA: Diagnosis not present

## 2022-03-20 HISTORY — DX: COVID-19: U07.1

## 2022-03-20 LAB — POCT INFLUENZA A/B
Influenza A, POC: NEGATIVE
Influenza B, POC: NEGATIVE

## 2022-03-20 LAB — POCT RAPID STREP A (OFFICE): Rapid Strep A Screen: NEGATIVE

## 2022-03-20 MED ORDER — LEVOFLOXACIN 750 MG PO TABS: 750.0000 mg | ORAL_TABLET | Freq: Every day | ORAL | 0 refills | Status: DC

## 2022-03-20 NOTE — ED Provider Notes (Signed)
[facility]    CSN: [id number] Arrival date & time: 03/20/22  0944      History   Chief Complaint Chief Complaint  Patient presents with   Facial Pain    HPI [name] is a 51 y.o. female.   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 Sinus congestion and PND It is getting worse last few days with green sputum and more fatigue  Past Medical History:  Diagnosis Date   Anxiety    COVID-19    x 2 - most recent 11/23   Depression    Endometriosis    Fibroid    Fibromyalgia    History of chicken pox    History of migraine    History of UTI    Right thyroid nodule    endo eval summer 2015 - bx: benign follicular nodule    Patient Active Problem List   Diagnosis Date Noted   Major depressive disorder, recurrent episode, moderate ([place]) 11/15/2021   Generalized anxiety disorder 11/15/2021   Lumbar pain 06/24/2015   Sinusitis, chronic 05/24/2015   Pre-employment examination 03/11/2015   Chest pain 10/10/2014   Panic 10/10/2014   Mixed headache 05/30/2014   Goiter 09/18/2013   Hair loss    Depression with anxiety    Fibromyalgia     Past Surgical History:  Procedure Laterality Date   ABDOMINAL HYSTERECTOMY  04/11/2009   BREAST BIOPSY  04/11/2005   dense breast   TONSILLECTOMY  04/11/1986    OB History     Gravida  1   Para      Term      Preterm      AB  1   Living         SAB  1   IAB      Ectopic      Multiple      Live Births               Home Medications    Prior to Admission medications   Medication Sig Start Date End Date Taking? Authorizing Provider  levofloxacin (LEVAQUIN) 750 MG tablet Take 1 tablet (750 mg total) by mouth daily. 03/20/22  Yes Raylene Everts, MD  acetaminophen (TYLENOL) 500 MG tablet Take 1,000 mg by mouth every 6 (six) hours as needed for moderate pain.    [provider]  albuterol (VENTOLIN HFA) 108 (90 Base) MCG/ACT  inhaler SMARTSIG:1 Puff(s) Via Inhaler 3 Times Daily PRN    [provider]  ALPRAZolam (XANAX) 0.25 MG tablet Take 1 tablet (0.25 mg total) by mouth daily as needed for anxiety. 1/2 - 2 tabs by mouth twice per day as needed Patient taking differently: Take 0.125 mg by mouth daily as needed for anxiety. 05/22/15   Hoyt Koch, MD  atenolol (TENORMIN) 50 MG tablet Take 1 tablet (50 mg total) by mouth daily. Need office visit for further refills Patient taking differently: Take 50 mg by mouth at bedtime. Need office visit for further refills 12/15/16   Hoyt Koch, MD  cetirizine (ZYRTEC) 10 MG tablet Take 10 mg by mouth daily as needed for allergies.    [provider]  diclofenac Sodium (VOLTAREN) 1 % GEL Apply 1 Application topically 2 (two) times daily as needed (pain).    [provider]  levothyroxine (SYNTHROID) 150 MCG tablet Take 150 mcg by mouth daily. 08/26/21   [provider]  pantoprazole (  PROTONIX) 40 MG tablet Take 40 mg by mouth 2 (two) times daily. 06/12/21   [provider]  sodium chloride (OCEAN) 0.65 % SOLN nasal spray Place 1 spray into both nostrils as needed for congestion.    [provider]  SUMAtriptan (IMITREX) 100 MG tablet Take 100 mg by mouth every 2 (two) hours as needed for migraine. 06/02/21   [provider]    Family History Family History  Problem Relation Age of Onset   Arthritis Mother    Hyperlipidemia Mother    Hypertension Mother    Stroke Mother    Mental illness Mother    Arthritis Father    Hyperlipidemia Father    Hypertension Father    Diabetes Father    Hypertension Brother    Heart disease Maternal Grandmother    Stroke Maternal Grandmother    Prostate cancer Paternal Grandfather    Breast cancer Cousin     Social History Social History   Tobacco Use   Smoking status: Never  Vaping Use   Vaping Use: Never used  Substance Use Topics   Alcohol use: No    Drug use: No     Allergies   Azithromycin, Cefdinir, Other, Doxycycline, Erythromycin, Keflex [cephalexin], Latex, Penicillins, and Sulfa antibiotics   Review of Systems Review of Systems  See HPI Physical Exam Triage Vital Signs ED Triage Vitals  Enc Vitals Group     BP 03/20/22 1014 (!) 140/85     Pulse Rate 03/20/22 1014 (!) 57     Resp 03/20/22 1014 18     Temp 03/20/22 1014 99.6 F (37.6 C)     Temp Source 03/20/22 1014 Oral     SpO2 03/20/22 1014 96 %     Weight 03/20/22 1018 220 lb (99.8 kg)     Height 03/20/22 1018 '5\' 3"'$  (1.6 m)     Head Circumference --      Peak Flow --      Pain Score 03/20/22 1017 4     Pain Loc --      Pain Edu? --      Excl. in [place]? --    No data found.  Updated Vital Signs BP (!) 140/85 (BP Location: Left Arm)   Pulse (!) 57   Temp 99.6 F (37.6 C) (Oral)   Resp 18   Ht '5\' 3"'$  (1.6 m)   Wt 99.8 kg   SpO2 96%   BMI 38.97 kg/m      Physical Exam Constitutional:      General: She is not in acute distress.    Appearance: She is well-developed. She is obese. She is ill-appearing.  HENT:     Head: Normocephalic and atraumatic.     Right Ear: Tympanic membrane and ear canal normal.     Left Ear: Tympanic membrane and ear canal normal.     Nose: Congestion and rhinorrhea present.     Mouth/Throat:     Pharynx: Posterior oropharyngeal erythema present.  Eyes:     Conjunctiva/sclera: Conjunctivae normal.     Pupils: Pupils are equal, round, and reactive to light.  Cardiovascular:     Rate and Rhythm: Normal rate.     Heart sounds: Normal heart sounds.  Pulmonary:     Effort: Pulmonary effort is normal. No respiratory distress.     Breath sounds: Rhonchi present.  Abdominal:     General: There is no distension.     Palpations: Abdomen is soft.  Musculoskeletal:  General: Normal range of motion.     Cervical back: Normal range of motion.  Lymphadenopathy:     Cervical: No cervical adenopathy.  Skin:    General: Skin  is warm and dry.  Neurological:     Mental Status: She is alert.  Psychiatric:        Mood and Affect: Mood normal.        Behavior: Behavior normal.      UC Treatments / Results  Labs (all labs ordered are listed, but only abnormal results are displayed) Labs Reviewed  POCT INFLUENZA A/B  POCT RAPID STREP A (OFFICE)    EKG   Radiology DG Chest 2 View  Result Date: 03/20/2022 CLINICAL DATA:  4 week history cough. EXAM: CHEST - 2 VIEW COMPARISON:  03/24/2017 FINDINGS: Stable mild asymmetric elevation right hemidiaphragm. The lungs are clear without focal pneumonia, edema, pneumothorax or pleural effusion. The cardiopericardial silhouette is within normal limits for size. The visualized bony structures of the thorax are unremarkable. IMPRESSION: No active cardiopulmonary disease. Electronically Signed   By: Misty Stanley M.D.   On: 03/20/2022 11:30    Procedures Procedures (including critical care time)  Medications Ordered in UC Medications - No data to display  Initial Impression / Assessment and Plan / UC Course  I have reviewed the triage vital signs and the nursing notes.  Pertinent labs & imaging results that were available during my care of the patient were reviewed by me and considered in my medical decision making (see chart for details).    Concern for persistence of SX Final Clinical Impressions(s) / UC Diagnoses   Final diagnoses:  History of COVID-19  Subacute cough     Discharge Instructions      May continue over-the-counter cough and cold medications Drink lots of fluids Take the levofloxacin once a day for 7 days See your doctor if not improving by next week   ED Prescriptions     Medication Sig Dispense Auth. Provider   levofloxacin (LEVAQUIN) 750 MG tablet Take 1 tablet (750 mg total) by mouth daily. 7 tablet Raylene Everts, MD      PDMP not reviewed this encounter.   Raylene Everts, MD 03/20/22 [phone]

## 2022-03-20 NOTE — Discharge Instructions (Signed)
May continue over-the-counter cough and cold medications Drink lots of fluids Take the levofloxacin once a day for 7 days See your doctor if not improving by next week

## 2022-03-20 NOTE — ED Triage Notes (Signed)
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

## 2022-03-24 ENCOUNTER — Ambulatory Visit: Admitting: Professional

## 2022-03-30 NOTE — Progress Notes (Signed)
New Patient Office Visit  Subjective    Patient ID: [name], female    DOB: 04/10/1971  Age: 51 y.o. MRN: [id number]  CC:  Chief Complaint  Patient presents with   Establish Care    HPI [name] presents to establish care  Have a couple of things that she would like to discuss today.  1 is that she has been sick a lot since August.  She was actually post to have sinus surgery this prior summer but ended up canceling.  But ever since August of this year she has had back-to-back infections.  They had recommended repairing her deviated septum.  They were hoping would also reduce the frequency of infections.  She also wanted to discuss her mood.  She is recently been seeing her therapist every 2 weeks.  She has tried multiple antidepressants in the past.  The only one that worked well for her was Lexapro.  She was on it for years until she started to develop brain zaps.  She was taken off of the medication at that time and has yet to find 1 that she feels has worked well for her or that she did not have side effects with.  She has tried Wellbutrin.  She just notices that she is tearful and most daily.  Her husband is a wheel in a wheelchair but sometimes gets very frustrated with him and irritable.  She just feels like her quality of life would be better if she were back on medication though she does not want to be to be on medication.  She also has a history of obstructive sleep apnea but has not really been able to wear her CPAP because she feels like after about an hour she wakes up because the pressure is too high.  She has had a current machine for at least 3 years.  She is no longer using an agency and is not quite sure how to adjust the machine herself.      03/31/2022   10:57 AM  Depression screen PHQ 2/9  Decreased Interest 1  Down, Depressed, Hopeless 0  PHQ - 2 Score 1  Altered sleeping 3  Tired, decreased energy 3  Change in appetite 3  Feeling bad or failure about  yourself  1  Trouble concentrating 0  Moving slowly or fidgety/restless 0  Suicidal thoughts 0  PHQ-9 Score 11  Difficult doing work/chores Somewhat difficult      03/31/2022   10:57 AM  GAD 7 : Generalized Anxiety Score  Nervous, Anxious, on Edge 2  Worry too much - different things 3  Trouble relaxing 1  Restless 0  Easily annoyed or irritable 0  Afraid - awful might happen 2  Anxiety Difficulty Somewhat difficult      Outpatient Encounter Medications as of 03/31/2022  Medication Sig   acetaminophen (TYLENOL) 500 MG tablet Take 1,000 mg by mouth every 6 (six) hours as needed for moderate pain.   albuterol (VENTOLIN HFA) 108 (90 Base) MCG/ACT inhaler SMARTSIG:1 Puff(s) Via Inhaler 3 Times Daily PRN   ALPRAZolam (XANAX) 0.25 MG tablet Take 1 tablet (0.25 mg total) by mouth daily as needed for anxiety. 1/2 - 2 tabs by mouth twice per day as needed (Patient taking differently: Take 0.125 mg by mouth daily as needed for anxiety.)   atenolol (TENORMIN) 50 MG tablet Take 1 tablet (50 mg total) by mouth daily. Need office visit for further refills (Patient taking differently: Take 50 mg by  mouth at bedtime. Need office visit for further refills)   cetirizine (ZYRTEC) 10 MG tablet Take 10 mg by mouth daily as needed for allergies.   diclofenac Sodium (VOLTAREN) 1 % GEL Apply 1 Application topically 2 (two) times daily as needed (pain).   escitalopram (LEXAPRO) 10 MG tablet 1/2 tab po QD x 14 days and then increase to whole tab daily.   fluticasone (FLONASE) 50 MCG/ACT nasal spray Place into both nostrils daily.   levothyroxine (SYNTHROID) 150 MCG tablet Take 150 mcg by mouth daily.   pantoprazole (PROTONIX) 40 MG tablet Take 40 mg by mouth 2 (two) times daily.   sodium chloride (OCEAN) 0.65 % SOLN nasal spray Place 1 spray into both nostrils as needed for congestion.   SUMAtriptan (IMITREX) 100 MG tablet Take 100 mg by mouth every 2 (two) hours as needed for migraine.   [DISCONTINUED]  levofloxacin (LEVAQUIN) 750 MG tablet Take 1 tablet (750 mg total) by mouth daily.   No facility-administered encounter medications on file as of 03/31/2022.    Past Medical History:  Diagnosis Date   Anxiety    COVID-19    x 2 - most recent 11/23   Depression    Endometriosis    Fibroid    Fibromyalgia    History of chicken pox    History of migraine    History of UTI    Right thyroid nodule    endo eval summer 2015 - bx: benign follicular nodule    Past Surgical History:  Procedure Laterality Date   ABDOMINAL HYSTERECTOMY  04/11/2009   BREAST BIOPSY  04/11/2005   dense breast   TONSILLECTOMY  04/11/1986    Family History  Problem Relation Age of Onset   Arthritis Mother    Hyperlipidemia Mother    Hypertension Mother    Stroke Mother    Mental illness Mother    Arthritis Father    Hyperlipidemia Father    Hypertension Father    Diabetes Father    Hypertension Brother    Heart disease Maternal Grandmother    Stroke Maternal Grandmother    Prostate cancer Paternal Grandfather    Breast cancer Cousin     Social History   Socioeconomic History   Marital status: Married    Spouse name: Not on file   Number of children: Not on file   Years of education: Not on file   Highest education level: Not on file  Occupational History   Not on file  Tobacco Use   Smoking status: Never   Smokeless tobacco: Not on file  Vaping Use   Vaping Use: Never used  Substance and Sexual Activity   Alcohol use: No   Drug use: No   Sexual activity: Yes    Partners: Male    Comment: Hyst  Other Topics Concern   Not on file  Social History Narrative   Not on file   Social Determinants of Health   Financial Resource Strain: Not on file  Food Insecurity: Not on file  Transportation Needs: Not on file  Physical Activity: Not on file  Stress: Not on file  Social Connections: Not on file  Intimate Partner Violence: Not on file    ROS      Objective    BP (!)  146/86   Pulse 78   Ht '5\' 3"'$  (1.6 m)   Wt 230 lb (104.3 kg)   SpO2 97%   BMI 40.74 kg/m   Physical Exam Vitals and nursing  note reviewed.  Constitutional:      Appearance: She is well-developed.  HENT:     Head: Normocephalic and atraumatic.  Cardiovascular:     Rate and Rhythm: Normal rate and regular rhythm.     Heart sounds: Normal heart sounds.  Pulmonary:     Effort: Pulmonary effort is normal.     Breath sounds: Normal breath sounds.  Skin:    General: Skin is warm and dry.  Neurological:     Mental Status: She is alert and oriented to person, place, and time.  Psychiatric:        Behavior: Behavior normal.         Assessment & Plan:   Problem List Items Addressed This Visit       Respiratory   OSA (obstructive sleep apnea)    Since no longer connected with an agency for her CPAP or CPAP supplies we will need to initiate a new referral but I need to get a copy of her sleep study.  Previously going to a place in [place]        Other   Major depressive disorder, recurrent episode, moderate ([place]) - Primary    Discussed options.  She is okay with retrying the Lexapro we will start with 5 mg for 2 weeks and then go up to 10 mg we will try to stay at 10 mg if tolerated well.  She does use alprazolam as needed.  Though it looks like it has not been filled in quite some time which is good.  It sounds like she is using it very sparingly.  Follow-up in 3 to 4 weeks.  She is already engaged in therapy every 2 weeks.      Relevant Medications   escitalopram (LEXAPRO) 10 MG tablet   Generalized anxiety disorder   Relevant Medications   escitalopram (LEXAPRO) 10 MG tablet   Other Visit Diagnoses     Need for immunization against influenza       Relevant Orders   Flu Vaccine QUAD 48moIM (Fluarix, Fluzone & Alfiuria Quad PF) (Completed)       Return in about 26 days (around 04/26/2022) for New start medication.   CBeatrice Lecher MD

## 2022-03-31 ENCOUNTER — Ambulatory Visit (INDEPENDENT_AMBULATORY_CARE_PROVIDER_SITE_OTHER): Admitting: Family Medicine

## 2022-03-31 ENCOUNTER — Encounter: Payer: Self-pay | Admitting: Family Medicine

## 2022-03-31 ENCOUNTER — Telehealth: Payer: Self-pay | Admitting: Family Medicine

## 2022-03-31 VITALS — BP 146/86 | HR 78 | Ht 63.0 in | Wt 230.0 lb

## 2022-03-31 DIAGNOSIS — F411 Generalized anxiety disorder: Secondary | ICD-10-CM | POA: Diagnosis not present

## 2022-03-31 DIAGNOSIS — F331 Major depressive disorder, recurrent, moderate: Secondary | ICD-10-CM

## 2022-03-31 DIAGNOSIS — Z23 Encounter for immunization: Secondary | ICD-10-CM | POA: Diagnosis not present

## 2022-03-31 DIAGNOSIS — G4733 Obstructive sleep apnea (adult) (pediatric): Secondary | ICD-10-CM | POA: Diagnosis not present

## 2022-03-31 MED ORDER — ESCITALOPRAM OXALATE 10 MG PO TABS: ORAL_TABLET | ORAL | 1 refills | Status: DC

## 2022-03-31 NOTE — Assessment & Plan Note (Signed)
Discussed options.  She is okay with retrying the Lexapro we will start with 5 mg for 2 weeks and then go up to 10 mg we will try to stay at 10 mg if tolerated well.  She does use alprazolam as needed.  Though it looks like it has not been filled in quite some time which is good.  It sounds like she is using it very sparingly.  Follow-up in 3 to 4 weeks.  She is already engaged in therapy every 2 weeks.

## 2022-03-31 NOTE — Assessment & Plan Note (Signed)
Since no longer connected with an agency for her CPAP or CPAP supplies we will need to initiate a new referral but I need to get a copy of her sleep study.  Previously going to a place in [place]

## 2022-04-01 NOTE — Telephone Encounter (Signed)
Left message for a return call

## 2022-04-21 ENCOUNTER — Ambulatory Visit: Admitting: Professional

## 2022-04-26 ENCOUNTER — Encounter: Payer: Self-pay | Admitting: Family Medicine

## 2022-04-26 ENCOUNTER — Ambulatory Visit (INDEPENDENT_AMBULATORY_CARE_PROVIDER_SITE_OTHER): Admitting: Family Medicine

## 2022-04-26 VITALS — BP 163/85 | HR 50 | Ht 63.0 in | Wt 228.0 lb

## 2022-04-26 DIAGNOSIS — G4733 Obstructive sleep apnea (adult) (pediatric): Secondary | ICD-10-CM | POA: Diagnosis not present

## 2022-04-26 DIAGNOSIS — I10 Essential (primary) hypertension: Secondary | ICD-10-CM | POA: Diagnosis not present

## 2022-04-26 DIAGNOSIS — F418 Other specified anxiety disorders: Secondary | ICD-10-CM

## 2022-04-26 MED ORDER — METHOCARBAMOL 500 MG PO TABS: 500.0000 mg | ORAL_TABLET | Freq: Every evening | ORAL | 1 refills | Status: DC | PRN

## 2022-04-26 MED ORDER — OLMESARTAN MEDOXOMIL-HCTZ 20-12.5 MG PO TABS: 1.0000 | ORAL_TABLET | Freq: Every day | ORAL | 2 refills | Status: DC

## 2022-04-26 NOTE — Progress Notes (Signed)
Established Patient Office Visit  Subjective   Patient ID: [name], female    DOB: [birth year]  Age: 52 y.o. MRN: [id number]  Chief Complaint  Patient presents with   Follow-up    Pt states she has been doing good with the lexapro '5mg'$ , pt is completing PHQ 9 & GAD 7. Pt will schedule mammogram has already had flu and has questions about shingrix and a non drowsy muscle relaxer, B/P 163/85 P 50    HPI  So far she has been happy with the Lexapro and feels like it has been helpful she says she thinks she is ready to go up to the 10 mg she was not certain she should go up or wait until her appointment today she has not noticed anything negative.  She has been having a lot of joint pain particularly in her hips knees and back.  She does have a history of fibromyalgia.  She says she is tried Flexeril in the past but it was overly sedating and made her feel groggy the next day but did try Robaxin in the past and found good relief with that.  She was on that for a period of time for some cervical issues.  She still needs to address her CPAP.  She is no longer going through a supplier but feels like the machine is just a little too powerful.  It has been waking her up at night it starts out okay she states she has really only been able to wear it for about 2 hours.  She is also noted that her blood pressures have been elevated.  She is taking her atenolol in the evening she said she talked to a friend who said that that type of medication made him feel tired and she wonders if that could be contributing.    ROS    Objective:     BP (!) 163/85   Pulse (!) 50   Ht '5\' 3"'$  (1.6 m)   Wt 228 lb (103.4 kg)   SpO2 99%   BMI 40.39 kg/m    Physical Exam Vitals and nursing note reviewed.  Constitutional:      Appearance: She is well-developed.  HENT:     Head: Normocephalic and atraumatic.  Cardiovascular:     Rate and Rhythm: Normal rate and regular rhythm.     Heart sounds: Normal  heart sounds.  Pulmonary:     Effort: Pulmonary effort is normal.     Breath sounds: Normal breath sounds.  Skin:    General: Skin is warm and dry.  Neurological:     Mental Status: She is alert and oriented to person, place, and time.  Psychiatric:        Behavior: Behavior normal.      No results found for any visits on 04/26/22.    The 10-year ASCVD risk score (Arnett DK, et al., 2019) is: 6.6%    Assessment & Plan:   Problem List Items Addressed This Visit       Cardiovascular and Mediastinum   Essential hypertension - Primary    Discontinue atenolol.  Start omelsartan HCT.  She is hoping that the diuretic would also improve some swelling that she gets intermittently in her hands.  Will need repeat BMP at follow-up.      Relevant Medications   olmesartan-hydrochlorothiazide (BENICAR HCT) 20-12.5 MG tablet     Respiratory   OSA (obstructive sleep apnea)    She will let me know what pressure  her machine is set to will likely turn it down just slightly and let her get readjusted and see if that is more helpful.  We can always work on gradually increasing back up if needed.        Other   Depression with anxiety    PHQ-9 score improved from 11 down to 8.  Increase Lexapro to 10 mg.  Follow-up in 8 weeks.       Return in about 8 weeks (around 06/21/2022) for follow mood medication and BP.    Beatrice Lecher, MD

## 2022-04-26 NOTE — Patient Instructions (Addendum)
OK to increase the Lexapro to '10mg'$  daily.    Send me a note for MyChart with your CPAP pressure when you can.

## 2022-04-27 ENCOUNTER — Other Ambulatory Visit: Payer: Self-pay | Admitting: Family Medicine

## 2022-04-27 DIAGNOSIS — F411 Generalized anxiety disorder: Secondary | ICD-10-CM

## 2022-04-27 DIAGNOSIS — F331 Major depressive disorder, recurrent, moderate: Secondary | ICD-10-CM

## 2022-04-27 NOTE — Assessment & Plan Note (Signed)
PHQ-9 score improved from 11 down to 8.  Increase Lexapro to 10 mg.  Follow-up in 8 weeks.

## 2022-04-27 NOTE — Assessment & Plan Note (Signed)
Discontinue atenolol.  Start omelsartan HCT.  She is hoping that the diuretic would also improve some swelling that she gets intermittently in her hands.  Will need repeat BMP at follow-up.

## 2022-04-27 NOTE — Assessment & Plan Note (Signed)
She will let me know what pressure her machine is set to will likely turn it down just slightly and let her get readjusted and see if that is more helpful.  We can always work on gradually increasing back up if needed.

## 2022-05-05 ENCOUNTER — Ambulatory Visit: Admitting: Professional

## 2022-05-14 ENCOUNTER — Telehealth: Admitting: Nurse Practitioner

## 2022-05-14 DIAGNOSIS — H109 Unspecified conjunctivitis: Secondary | ICD-10-CM

## 2022-05-14 MED ORDER — POLYMYXIN B-TRIMETHOPRIM 10000-0.1 UNIT/ML-% OP SOLN: 1.0000 [drp] | OPHTHALMIC | 0 refills | Status: AC

## 2022-05-14 NOTE — Progress Notes (Signed)
Virtual Visit Consent   [name], you are scheduled for a virtual visit with a [facility] provider today. Just as with appointments in the office, your consent must be obtained to participate. Your consent will be active for this visit and any virtual visit you may have with one of our providers in the next 365 days. If you have a MyChart account, a copy of this consent can be sent to you electronically.  As this is a virtual visit, video technology does not allow for your provider to perform a traditional examination. This may limit your provider's ability to fully assess your condition. If your provider identifies any concerns that need to be evaluated in person or the need to arrange testing (such as labs, EKG, etc.), we will make arrangements to do so. Although advances in technology are sophisticated, we cannot ensure that it will always work on either your end or our end. If the connection with a video visit is poor, the visit may have to be switched to a telephone visit. With either a video or telephone visit, we are not always able to ensure that we have a secure connection.  By engaging in this virtual visit, you consent to the provision of healthcare and authorize for your insurance to be billed (if applicable) for the services provided during this visit. Depending on your insurance coverage, you may receive a charge related to this service.  I need to obtain your verbal consent now. Are you willing to proceed with your visit today? [name] has provided verbal consent on 05/19/2022 for a virtual visit (video or telephone). Misty Pounds, NP  Date: 05/19/2022 1:09 PM  Virtual Visit via Video Note   I, [name], connected with  [name]  ([id number], 01-22-1971, 52) on 05/14/22 at 12:45 PM EST by a video-enabled telemedicine application and verified that I am speaking with the correct person using two identifiers.  Location: Patient: Virtual Visit Location Patient: Home Provider:  Virtual Visit Location Provider: Home Office   I discussed the limitations of evaluation and management by telemedicine and the availability of in person appointments. The patient expressed understanding and agreed to proceed.    History of Present Illness: [name] is a 52 y.o. who identifies as a female who was assigned female at birth, and is being seen today for pink eye  [name] notes redness, swelling, matting, crusting and irritation of the right eye. Does not endorse any visual changes or URI symptoms   Problems:  Patient Active Problem List   Diagnosis Date Noted   Essential hypertension 04/26/2022   OSA (obstructive sleep apnea) 03/31/2022   Major depressive disorder, recurrent episode, moderate (HCC) 11/15/2021   Generalized anxiety disorder 11/15/2021   Lumbar pain 06/24/2015   Sinusitis, chronic 05/24/2015   Panic 10/10/2014   Mixed headache 05/30/2014   Goiter 09/18/2013   Hair loss    Depression with anxiety    Fibromyalgia     Allergies:  Allergies  Allergen Reactions   Azithromycin Diarrhea    Abdominal pain - stomach ulcers   Cefdinir Diarrhea    Abdominal pain - has stomach ulcers   Other Dermatitis    Surgical glue    Doxycycline Itching and Rash   Erythromycin Itching and Rash   Keflex [Cephalexin] Rash   Latex Rash   Penicillins Rash   Sulfa Antibiotics Rash   Medications:  Current Outpatient Medications:    trimethoprim-polymyxin b (POLYTRIM) ophthalmic solution, Place 1 drop into  the right eye every 4 (four) hours for 7 days., Disp: 10 mL, Rfl: 0   acetaminophen (TYLENOL) 500 MG tablet, Take 1,000 mg by mouth every 6 (six) hours as needed for moderate pain., Disp: , Rfl:    albuterol (VENTOLIN HFA) 108 (90 Base) MCG/ACT inhaler, SMARTSIG:1 Puff(s) Via Inhaler 3 Times Daily PRN, Disp: , Rfl:    ALPRAZolam (XANAX) 0.25 MG tablet, Take 1 tablet (0.25 mg total) by mouth daily as needed for anxiety. 1/2 - 2 tabs by mouth twice per day as needed  (Patient taking differently: Take 0.125 mg by mouth daily as needed for anxiety.), Disp: 30 tablet, Rfl: 0   cetirizine (ZYRTEC) 10 MG tablet, Take 10 mg by mouth daily as needed for allergies., Disp: , Rfl:    diclofenac Sodium (VOLTAREN) 1 % GEL, Apply 1 Application topically 2 (two) times daily as needed (pain)., Disp: , Rfl:    escitalopram (LEXAPRO) 10 MG tablet, Take 1 tablet (10 mg total) by mouth daily., Disp: 90 tablet, Rfl: 1   fluticasone (FLONASE) 50 MCG/ACT nasal spray, Place into both nostrils daily., Disp: , Rfl:    levothyroxine (SYNTHROID) 150 MCG tablet, Take 150 mcg by mouth daily., Disp: , Rfl:    methocarbamol (ROBAXIN) 500 MG tablet, Take 1 tablet (500 mg total) by mouth at bedtime as needed for muscle spasms., Disp: 90 tablet, Rfl: 1   olmesartan-hydrochlorothiazide (BENICAR HCT) 20-12.5 MG tablet, Take 1 tablet by mouth daily., Disp: 30 tablet, Rfl: 2   pantoprazole (PROTONIX) 40 MG tablet, Take 40 mg by mouth 2 (two) times daily., Disp: , Rfl:    sodium chloride (OCEAN) 0.65 % SOLN nasal spray, Place 1 spray into both nostrils as needed for congestion., Disp: , Rfl:    SUMAtriptan (IMITREX) 100 MG tablet, Take 100 mg by mouth every 2 (two) hours as needed for migraine., Disp: , Rfl:   Observations/Objective: Patient is well-developed, well-nourished in no acute distress.  Resting comfortably at home.  Head is normocephalic, atraumatic.  No labored breathing.  Speech is clear and coherent with logical content.  Patient is alert and oriented at baseline.  Right eye with injected sclera  Assessment and Plan: 1. Bacterial conjunctivitis of right eye - trimethoprim-polymyxin b (POLYTRIM) ophthalmic solution; Place 1 drop into the right eye every 4 (four) hours for 7 days.  Dispense: 10 mL; Refill: 0 Apply cold compresses to right eye for pain relief  Follow Up Instructions: I discussed the assessment and treatment plan with the patient. The patient was provided an  opportunity to ask questions and all were answered. The patient agreed with the plan and demonstrated an understanding of the instructions.  A copy of instructions were sent to the patient via MyChart unless otherwise noted below.    The patient was advised to call back or seek an in-person evaluation if the symptoms worsen or if the condition fails to improve as anticipated.  Time:  I spent 11 minutes with the patient via telehealth technology discussing the above problems/concerns.    Misty Pounds, NP

## 2022-05-19 ENCOUNTER — Encounter: Payer: Self-pay | Admitting: Nurse Practitioner

## 2022-05-19 ENCOUNTER — Ambulatory Visit: Admitting: Professional

## 2022-05-19 NOTE — Patient Instructions (Signed)
[name], thank you for joining [name], NP for today's virtual visit.  While this provider is not your primary care provider (PCP), if your PCP is located in our provider database this encounter information will be shared with them immediately following your visit.   [facility] account gives you access to today's visit and all your visits, tests, and labs performed at [facility] " click here if you don't have a [facility] account or go to [email or web address]  Consent: (Patient) [name] provided verbal consent for this virtual visit at the beginning of the encounter.  Current Medications:  Current Outpatient Medications:    trimethoprim-polymyxin b (POLYTRIM) ophthalmic solution, Place 1 drop into the right eye every 4 (four) hours for 7 days., Disp: 10 mL, Rfl: 0   acetaminophen (TYLENOL) 500 MG tablet, Take 1,000 mg by mouth every 6 (six) hours as needed for moderate pain., Disp: , Rfl:    albuterol (VENTOLIN HFA) 108 (90 Base) MCG/ACT inhaler, SMARTSIG:1 Puff(s) Via Inhaler 3 Times Daily PRN, Disp: , Rfl:    ALPRAZolam (XANAX) 0.25 MG tablet, Take 1 tablet (0.25 mg total) by mouth daily as needed for anxiety. 1/2 - 2 tabs by mouth twice per day as needed (Patient taking differently: Take 0.125 mg by mouth daily as needed for anxiety.), Disp: 30 tablet, Rfl: 0   cetirizine (ZYRTEC) 10 MG tablet, Take 10 mg by mouth daily as needed for allergies., Disp: , Rfl:    diclofenac Sodium (VOLTAREN) 1 % GEL, Apply 1 Application topically 2 (two) times daily as needed (pain)., Disp: , Rfl:    escitalopram (LEXAPRO) 10 MG tablet, Take 1 tablet (10 mg total) by mouth daily., Disp: 90 tablet, Rfl: 1   fluticasone (FLONASE) 50 MCG/ACT nasal spray, Place into both nostrils daily., Disp: , Rfl:    levothyroxine (SYNTHROID) 150 MCG tablet, Take 150 mcg by mouth daily., Disp: , Rfl:    methocarbamol (ROBAXIN) 500 MG tablet, Take 1 tablet (500 mg total) by  mouth at bedtime as needed for muscle spasms., Disp: 90 tablet, Rfl: 1   olmesartan-hydrochlorothiazide (BENICAR HCT) 20-12.5 MG tablet, Take 1 tablet by mouth daily., Disp: 30 tablet, Rfl: 2   pantoprazole (PROTONIX) 40 MG tablet, Take 40 mg by mouth 2 (two) times daily., Disp: , Rfl:    sodium chloride (OCEAN) 0.65 % SOLN nasal spray, Place 1 spray into both nostrils as needed for congestion., Disp: , Rfl:    SUMAtriptan (IMITREX) 100 MG tablet, Take 100 mg by mouth every 2 (two) hours as needed for migraine., Disp: , Rfl:    Medications ordered in this encounter:  Meds ordered this encounter  Medications   trimethoprim-polymyxin b (POLYTRIM) ophthalmic solution    Sig: Place 1 drop into the right eye every 4 (four) hours for 7 days.    Dispense:  10 mL    Refill:  0    Order Specific Question:   Supervising Provider    Answer:   Chase Picket [[id number]]     *If you need refills on other medications prior to your next appointment, please contact your pharmacy*  Follow-Up: Call back or seek an in-person evaluation if the symptoms worsen or if the condition fails to improve as anticipated.  [facility] [phone]  Other Instructions Apply cold compresses to eye for pain relief   If you have been instructed to have an in-person evaluation today at a local Urgent Care facility, please  use the link below. It will take you to a list of all of our available [facility], including address, phone number and hours of operation. Please do not delay care.  [facility]  If you or a family member do not have a primary care provider, use the link below to schedule a visit and establish care. When you choose a [facility] primary care physician or advanced practice provider, you gain a long-term partner in health. Find a Primary Care Provider  Learn more about [facility]'s in-office and virtual care options: [facility]

## 2022-05-28 ENCOUNTER — Other Ambulatory Visit: Payer: Self-pay | Admitting: Family Medicine

## 2022-05-28 DIAGNOSIS — I10 Essential (primary) hypertension: Secondary | ICD-10-CM

## 2022-06-02 ENCOUNTER — Ambulatory Visit: Admitting: Professional

## 2022-06-12 ENCOUNTER — Ambulatory Visit
Admission: EM | Admit: 2022-06-12 | Discharge: 2022-06-12 | Disposition: A | Discharge status: Home / Self Care | Attending: Family Medicine | Admitting: Family Medicine

## 2022-06-12 ENCOUNTER — Ambulatory Visit: Admit: 2022-06-12

## 2022-06-12 ENCOUNTER — Encounter: Payer: Self-pay | Admitting: Emergency Medicine

## 2022-06-12 DIAGNOSIS — B9689 Other specified bacterial agents as the cause of diseases classified elsewhere: Secondary | ICD-10-CM

## 2022-06-12 DIAGNOSIS — J019 Acute sinusitis, unspecified: Secondary | ICD-10-CM

## 2022-06-12 MED ORDER — FLUTICASONE PROPIONATE 50 MCG/ACT NA SUSP: 2.0000 | Freq: Every day | NASAL | 0 refills | Status: AC

## 2022-06-12 MED ORDER — FLUCONAZOLE 150 MG PO TABS: 150.0000 mg | ORAL_TABLET | Freq: Every day | ORAL | 0 refills | Status: DC

## 2022-06-12 MED ORDER — LEVOFLOXACIN 500 MG PO TABS: 500.0000 mg | ORAL_TABLET | Freq: Every day | ORAL | 0 refills | Status: DC

## 2022-06-12 NOTE — ED Provider Notes (Signed)
[facility]    CSN: [id number] Arrival date & time: 06/12/22  0946      History   Chief Complaint Chief Complaint  Patient presents with   Sinus Congestion    HPI [name] is a 52 y.o. female.   HPI  Patient states she has had an infection for 2 weeks.  Sinus pressure and pain.  Yellow postnasal drip.  Nosebleeds.  She is tried over-the-counter medications.  She states that she is not getting better.  In the past she has had sinus infections that require antibiotics.  She is allergic to multiple antibiotics.  They will do when she knows that she can tolerate and is Levaquin.  She is advised to use probiotics while on the Levaquin.  She sometimes needs Diflucan if taking an antibiotic.  Past Medical History:  Diagnosis Date   Anxiety    COVID-19    x 2 - most recent 11/23   Depression    Endometriosis    Fibroid    Fibromyalgia    History of chicken pox    History of migraine    History of UTI    Right thyroid nodule    endo eval summer 2015 - bx: benign follicular nodule    Patient Active Problem List   Diagnosis Date Noted   Essential hypertension 04/26/2022   OSA (obstructive sleep apnea) 03/31/2022   Major depressive disorder, recurrent episode, moderate (HCC) 11/15/2021   Generalized anxiety disorder 11/15/2021   Lumbar pain 06/24/2015   Sinusitis, chronic 05/24/2015   Panic 10/10/2014   Mixed headache 05/30/2014   Goiter 09/18/2013   Hair loss    Depression with anxiety    Fibromyalgia     Past Surgical History:  Procedure Laterality Date   ABDOMINAL HYSTERECTOMY  04/11/2009   BREAST BIOPSY  04/11/2005   dense breast   TONSILLECTOMY  04/11/1986    OB History     Gravida  1   Para      Term      Preterm      AB  1   Living         SAB  1   IAB      Ectopic      Multiple      Live Births               Home Medications    Prior to Admission medications   Medication Sig Start Date End Date Taking?  Authorizing Provider  acetaminophen (TYLENOL) 500 MG tablet Take 1,000 mg by mouth every 6 (six) hours as needed for moderate pain.   Yes [provider]  albuterol (VENTOLIN HFA) 108 (90 Base) MCG/ACT inhaler SMARTSIG:1 Puff(s) Via Inhaler 3 Times Daily PRN   Yes [provider]  ALPRAZolam (XANAX) 0.25 MG tablet Take 1 tablet (0.25 mg total) by mouth daily as needed for anxiety. 1/2 - 2 tabs by mouth twice per day as needed Patient taking differently: Take 0.125 mg by mouth daily as needed for anxiety. 05/22/15  Yes Hoyt Koch, MD  cetirizine (ZYRTEC) 10 MG tablet Take 10 mg by mouth daily as needed for allergies.   Yes [provider]  diclofenac Sodium (VOLTAREN) 1 % GEL Apply 1 Application topically 2 (two) times daily as needed (pain).   Yes [provider]  escitalopram (LEXAPRO) 10 MG tablet Take 1 tablet (10 mg total) by mouth daily. 04/27/22  Yes Hali Marry, MD  fluconazole (DIFLUCAN) 150  MG tablet Take 1 tablet (150 mg total) by mouth daily. Repeat in 1 week if needed 06/12/22  Yes Raylene Everts, MD  fluticasone [facility]) 50 MCG/ACT nasal spray Place into both nostrils daily.   Yes [provider]  fluticasone (FLONASE) 50 MCG/ACT nasal spray Place 2 sprays into both nostrils daily. 06/12/22  Yes Raylene Everts, MD  levofloxacin (LEVAQUIN) 500 MG tablet Take 1 tablet (500 mg total) by mouth daily. 06/12/22  Yes Raylene Everts, MD  levothyroxine (SYNTHROID) 150 MCG tablet Take 175 mcg by mouth daily. 08/26/21  Yes [provider]  methocarbamol (ROBAXIN) 500 MG tablet Take 1 tablet (500 mg total) by mouth at bedtime as needed for muscle spasms. 04/26/22  Yes Hali Marry, MD  olmesartan-hydrochlorothiazide (BENICAR HCT) 20-12.5 MG tablet TAKE 1 TABLET BY MOUTH EVERY DAY 05/30/22  Yes Hali Marry, MD  pantoprazole (PROTONIX) 40 MG tablet Take 40 mg by mouth 2 (two) times daily. 06/12/21  Yes  [provider]  sodium chloride (OCEAN) 0.65 % SOLN nasal spray Place 1 spray into both nostrils as needed for congestion.   Yes [provider]  SUMAtriptan (IMITREX) 100 MG tablet Take 100 mg by mouth every 2 (two) hours as needed for migraine. 06/02/21  Yes [provider]  Vitamin D, Ergocalciferol, (DRISDOL) 1.25 MG (50000 UNIT) CAPS capsule Take by mouth. 06/02/21  Yes [provider]    Family History Family History  Problem Relation Age of Onset   Arthritis Mother    Hyperlipidemia Mother    Hypertension Mother    Stroke Mother    Mental illness Mother    Arthritis Father    Hyperlipidemia Father    Hypertension Father    Diabetes Father    Hypertension Brother    Heart disease Maternal Grandmother    Stroke Maternal Grandmother    Prostate cancer Paternal Grandfather    Breast cancer Cousin     Social History Social History   Tobacco Use   Smoking status: Never  Vaping Use   Vaping Use: Never used  Substance Use Topics   Alcohol use: No   Drug use: No     Allergies   Azithromycin, Cefdinir, Other, Doxycycline, Erythromycin, Keflex [cephalexin], Latex, Penicillins, and Sulfa antibiotics   Review of Systems Review of Systems See HPI  Physical Exam Triage Vital Signs ED Triage Vitals  Enc Vitals Group     BP 06/12/22 1015 118/81     Pulse Rate 06/12/22 1015 71     Resp 06/12/22 1015 18     Temp 06/12/22 1015 98.5 F (36.9 C)     Temp Source 06/12/22 1015 Oral     SpO2 06/12/22 1015 96 %     Weight 06/12/22 1016 220 lb (99.8 kg)     Height 06/12/22 1016 '5\' 3"'$  (1.6 m)     Head Circumference --      Peak Flow --      Pain Score 06/12/22 1016 2     Pain Loc --      Pain Edu? --      Excl. in [place]? --    No data found.  Updated Vital Signs BP 118/81 (BP Location: Right Arm)   Pulse 71   Temp 98.5 F (36.9 C) (Oral)   Resp 18   Ht '5\' 3"'$  (1.6 m)   Wt 99.8 kg   SpO2 96%   BMI 38.97 kg/m     Physical  Exam  Constitutional:      General: She is not in acute distress.    Appearance: She is well-developed. She is obese. She is ill-appearing.  HENT:     Head: Normocephalic and atraumatic.     Right Ear: Tympanic membrane normal.     Left Ear: Tympanic membrane normal.     Nose: Congestion and rhinorrhea present.     Comments: Tonsillectomy.  Posterior pharynx is injected.  Yellow postnasal drip.  Nasal membranes are swollen and red.  Sinuses are tender    Mouth/Throat:     Pharynx: Oropharyngeal exudate and posterior oropharyngeal erythema present.  Eyes:     Conjunctiva/sclera: Conjunctivae normal.     Pupils: Pupils are equal, round, and reactive to light.  Cardiovascular:     Rate and Rhythm: Normal rate.  Pulmonary:     Effort: Pulmonary effort is normal. No respiratory distress.  Abdominal:     General: There is no distension.     Palpations: Abdomen is soft.  Musculoskeletal:        General: Normal range of motion.     Cervical back: Normal range of motion.  Skin:    General: Skin is warm and dry.  Neurological:     Mental Status: She is alert.      UC Treatments / Results  Labs (all labs ordered are listed, but only abnormal results are displayed) Labs Reviewed - No data to display  EKG   Radiology No results found.  Procedures Procedures (including critical care time)  Medications Ordered in UC Medications - No data to display  Initial Impression / Assessment and Plan / UC Course  I have reviewed the triage vital signs and the nursing notes.  Pertinent labs & imaging results that were available during my care of the patient were reviewed by me and considered in my medical decision making (see chart for details).      Final Clinical Impressions(s) / UC Diagnoses   Final diagnoses:  Acute bacterial sinusitis     Discharge Instructions      Drink lots of water Take Flonase as directed I have prescribed Levaquin for the infection.  Take once a  day Take Diflucan if needed Follow-up with your usual primary care doctor if you fail to improve    ED Prescriptions     Medication Sig Dispense Auth. Provider   levofloxacin (LEVAQUIN) 500 MG tablet Take 1 tablet (500 mg total) by mouth daily. 7 tablet Raylene Everts, MD   fluconazole (DIFLUCAN) 150 MG tablet Take 1 tablet (150 mg total) by mouth daily. Repeat in 1 week if needed 2 tablet Raylene Everts, MD   fluticasone [facility]) 50 MCG/ACT nasal spray Place 2 sprays into both nostrils daily. 16 g Raylene Everts, MD      PDMP not reviewed this encounter.   Raylene Everts, MD 06/12/22 [phone]

## 2022-06-12 NOTE — ED Triage Notes (Signed)
Patient c/o sinus congestion, ear pain, throat pain on the left side, continued nasal bleeds.  Patient did apply neosporin to the left nostril for nosebleeds.  Patient has taken Sudafed, Tylenol, Aleve and Zyrtec.

## 2022-06-12 NOTE — Discharge Instructions (Signed)
Drink lots of water Take Flonase as directed I have prescribed Levaquin for the infection.  Take once a day Take Diflucan if needed Follow-up with your usual primary care doctor if you fail to improve

## 2022-06-21 ENCOUNTER — Encounter: Payer: Self-pay | Admitting: Family Medicine

## 2022-06-21 ENCOUNTER — Ambulatory Visit (INDEPENDENT_AMBULATORY_CARE_PROVIDER_SITE_OTHER): Admitting: Family Medicine

## 2022-06-21 VITALS — BP 117/66 | HR 85 | Ht 63.0 in | Wt 229.0 lb

## 2022-06-21 DIAGNOSIS — F902 Attention-deficit hyperactivity disorder, combined type: Secondary | ICD-10-CM | POA: Diagnosis not present

## 2022-06-21 DIAGNOSIS — F418 Other specified anxiety disorders: Secondary | ICD-10-CM

## 2022-06-21 DIAGNOSIS — E876 Hypokalemia: Secondary | ICD-10-CM | POA: Diagnosis not present

## 2022-06-21 DIAGNOSIS — F909 Attention-deficit hyperactivity disorder, unspecified type: Secondary | ICD-10-CM | POA: Insufficient documentation

## 2022-06-21 DIAGNOSIS — E039 Hypothyroidism, unspecified: Secondary | ICD-10-CM | POA: Insufficient documentation

## 2022-06-21 DIAGNOSIS — I10 Essential (primary) hypertension: Secondary | ICD-10-CM

## 2022-06-21 DIAGNOSIS — F331 Major depressive disorder, recurrent, moderate: Secondary | ICD-10-CM

## 2022-06-21 DIAGNOSIS — R748 Abnormal levels of other serum enzymes: Secondary | ICD-10-CM

## 2022-06-21 MED ORDER — BUPROPION HCL ER (XL) 150 MG PO TB24: 150.0000 mg | ORAL_TABLET | Freq: Every day | ORAL | 0 refills | Status: DC

## 2022-06-21 NOTE — Progress Notes (Signed)
Established Patient Office Visit  Subjective   Patient ID: [name], female    DOB: 1970-10-30  Age: 52 y.o. MRN: [id number]  Chief Complaint  Patient presents with   Hypertension   mood    HPI Follow-up depression/anxiety-we increased her Lexapro to 10 mg at last office visit.  CPAP follow-up-last time I saw her she felt the pressure was a little too high she was given a try to check and see if she can figure out what pressure it was currently set to you.  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  Hypertension- Pt denies chest pain, SOB, dizziness, or heart palpitations.  Taking meds as directed w/o problems.  Denies medication side effects.  He discontinued the atenolol and switch to omelsartan HCT.   Also had recent blood work with her endocrinologist.  They did have to adjust her thyroid medication up by 25 mcg.  Her vitamin D was low so they also put her back on 5000 units weekly.  Potassium was also noted to be a little low at 3.2.  And ALT one of her liver enzymes is was elevated at 61 and her alk phos was up at 112.    ROS    Objective:     BP 117/66   Pulse 85   Ht '5\' 3"'$  (1.6 m)   Wt 229 lb (103.9 kg)   SpO2 97%   BMI 40.57 kg/m    Physical Exam Vitals and nursing note reviewed.  Constitutional:      Appearance: She is well-developed.  HENT:     Head: Normocephalic and atraumatic.  Cardiovascular:     Rate and Rhythm: Normal rate and regular rhythm.     Heart sounds: Normal heart sounds.  Pulmonary:     Effort: Pulmonary effort is normal.     Breath sounds: Normal breath sounds.  Skin:    General: Skin is warm and dry.  Neurological:     Mental Status: She is alert and oriented to person, place, and time.  Psychiatric:        Behavior: Behavior normal.      Results for orders placed or performed in visit on 06/21/22  HM COLONOSCOPY  Result Value Ref Range   HM Colonoscopy See Report (in chart) See Report (in chart), Patient  Reported      The 10-year ASCVD risk score (Arnett DK, et al., 2019) is: 3.4%    Assessment & Plan:   Problem List Items Addressed This Visit       Cardiovascular and Mediastinum   Essential hypertension    BP looks awesome!!!!!!! It is amazing.  Doing well on the medication. No S.E. she did have potassium recently checked with the endocrinologist and it was a little low at 3.2.  Send like to repeat that in 1 to 2 weeks.        Other   Major depressive disorder, recurrent episode, moderate ([place]) - Primary    She does feel like the Lexapro 10 mg has been helpful.  She still struggling a little bit with some anxiety symptoms but she feels like that is triggered by her ADHD.  She either feels like she is hyper organized or completely unorganized and being unorganized makes her feel very stressed.      Relevant Medications   buPROPion (WELLBUTRIN XL) 150 MG 24 hr tablet   Depression with anxiety   Relevant Medications   buPROPion (WELLBUTRIN XL) 150 MG 24  hr tablet   ADHD    At 1 time she was on a stimulant but then started getting some palpitations.  It sounds like she was on methylphenidate.  When she went to her PCP they felt like she should not be on the medication in combination with her thyroid medication so it was discontinued.  She is open to nonstimulant treatment options.  We can consider Wellbutrin versus Strattera.  Will start with Wellbutrin 150 mg extended release to see if that is helpful.  If not could consider Strattera or even working with her endocrinologist to make sure that we choose something safe for her ADHD.      Relevant Medications   buPROPion (WELLBUTRIN XL) 150 MG 24 hr tablet   Other Visit Diagnoses     Hypokalemia       Relevant Orders   COMPLETE METABOLIC PANEL WITH GFR   Elevated liver enzymes       Relevant Orders   COMPLETE METABOLIC PANEL WITH GFR      Hypokalemia-it could be because of the new start of the hydrochlorothiazide and his  blood pressure pill though it is in combination with an ARB so usually the combination works well.  But we will start by rechecking it first.  One of the liver enzymes and alkaline phosphatase was elevated on [facility] labs [facility] as [facility].  Will grant plan to recheck that in 1 to 2 weeks as well.  Return in about 3 months (around 09/21/2022) for new medication start for ADHD.    Beatrice Lecher, MD

## 2022-06-21 NOTE — Assessment & Plan Note (Signed)
At 1 time she was on a stimulant but then started getting some palpitations.  It sounds like she was on methylphenidate.  When she went to her PCP they felt like she should not be on the medication in combination with her thyroid medication so it was discontinued.  She is open to nonstimulant treatment options.  We can consider Wellbutrin versus Strattera.  Will start with Wellbutrin 150 mg extended release to see if that is helpful.  If not could consider Strattera or even working with her endocrinologist to make sure that we choose something safe for her ADHD.

## 2022-06-21 NOTE — Assessment & Plan Note (Signed)
BP looks awesome!!!!!!! It is amazing.  Doing well on the medication. No S.E. she did have potassium recently checked with the endocrinologist and it was a little low at 3.2.  Send like to repeat that in 1 to 2 weeks.

## 2022-06-21 NOTE — Assessment & Plan Note (Signed)
She does feel like the Lexapro 10 mg has been helpful.  She still struggling a little bit with some anxiety symptoms but she feels like that is triggered by her ADHD.  She either feels like she is hyper organized or completely unorganized and being unorganized makes her feel very stressed.

## 2022-07-29 ENCOUNTER — Encounter: Payer: Self-pay | Admitting: Family Medicine

## 2022-08-13 ENCOUNTER — Encounter: Payer: Self-pay | Admitting: *Deleted

## 2022-08-13 ENCOUNTER — Other Ambulatory Visit: Payer: Self-pay

## 2022-08-13 ENCOUNTER — Ambulatory Visit
Admission: EM | Admit: 2022-08-13 | Discharge: 2022-08-13 | Disposition: A | Discharge status: Home / Self Care | Attending: Family Medicine | Admitting: Family Medicine

## 2022-08-13 DIAGNOSIS — J309 Allergic rhinitis, unspecified: Secondary | ICD-10-CM

## 2022-08-13 DIAGNOSIS — J0100 Acute maxillary sinusitis, unspecified: Secondary | ICD-10-CM | POA: Diagnosis not present

## 2022-08-13 DIAGNOSIS — J320 Chronic maxillary sinusitis: Secondary | ICD-10-CM

## 2022-08-13 MED ORDER — FEXOFENADINE HCL 180 MG PO TABS: 180.0000 mg | ORAL_TABLET | Freq: Every day | ORAL | 0 refills | Status: DC

## 2022-08-13 MED ORDER — LEVOFLOXACIN 500 MG PO TABS: 500.0000 mg | ORAL_TABLET | Freq: Every day | ORAL | 0 refills | Status: AC

## 2022-08-13 NOTE — Discharge Instructions (Addendum)
Instructed patient to take medication as directed with food to completion.  Advised patient to take Allegra daily with Levaquin for the next 10 days.  Encouraged increase daily water intake to 64 ounces per day while taking this medication.  Advised if symptoms worsen and/or unresolved please follow-up with PCP or ENT for further evaluation.

## 2022-08-13 NOTE — ED Provider Notes (Signed)
[facility]    CSN: [id number] Arrival date & time: 08/13/22  1400      History   Chief Complaint Chief Complaint  Patient presents with   Nasal Congestion   Cough   Sore Throat    HPI Rossetta Weesner is a 52 y.o. female.   HPI 52 year old female presents with nasal congestion cough and a sore throat for 6-7 days.  PMH significant for obesity, fibromyalgia, and history of UTI.  Patient request Levaquin for sinus infection.  Past Medical History:  Diagnosis Date   Anxiety    COVID-19    x 2 - most recent 11/23   Depression    Endometriosis    Fibroid    Fibromyalgia    History of chicken pox    History of migraine    History of UTI    Right thyroid nodule    endo eval summer 2015 - bx: benign follicular nodule    Patient Active Problem List   Diagnosis Date Noted   ADHD 06/21/2022   Hypothyroid 06/21/2022   Essential hypertension 04/26/2022   OSA (obstructive sleep apnea) 03/31/2022   Major depressive disorder, recurrent episode, moderate (HCC) 11/15/2021   Generalized anxiety disorder 11/15/2021   Lumbar pain 06/24/2015   Sinusitis, chronic 05/24/2015   Panic 10/10/2014   Mixed headache 05/30/2014   Hair loss    Depression with anxiety    Fibromyalgia     Past Surgical History:  Procedure Laterality Date   ABDOMINAL HYSTERECTOMY  04/11/2009   BREAST BIOPSY  04/11/2005   dense breast   TONSILLECTOMY  04/11/1986    OB History     Gravida  1   Para      Term      Preterm      AB  1   Living         SAB  1   IAB      Ectopic      Multiple      Live Births               Home Medications    Prior to Admission medications   Medication Sig Start Date End Date Taking? Authorizing Provider  fexofenadine (ALLEGRA ALLERGY) 180 MG tablet Take 1 tablet (180 mg total) by mouth daily for 15 days. 08/13/22 08/28/22 Yes Trevor Iha, FNP  levofloxacin (LEVAQUIN) 500 MG tablet Take 1 tablet (500 mg total) by mouth daily for 10  days. 08/13/22 08/23/22 Yes Trevor Iha, FNP  acetaminophen (TYLENOL) 500 MG tablet Take 1,000 mg by mouth every 6 (six) hours as needed for moderate pain.    [provider]  albuterol (VENTOLIN HFA) 108 (90 Base) MCG/ACT inhaler SMARTSIG:1 Puff(s) Via Inhaler 3 Times Daily PRN    [provider]  ALPRAZolam (XANAX) 0.25 MG tablet Take 1 tablet (0.25 mg total) by mouth daily as needed for anxiety. 1/2 - 2 tabs by mouth twice per day as needed Patient taking differently: Take 0.125 mg by mouth daily as needed for anxiety. 05/22/15   Myrlene Broker, MD  buPROPion (WELLBUTRIN XL) 150 MG 24 hr tablet Take 1 tablet (150 mg total) by mouth daily. 06/21/22   Agapito Games, MD  diclofenac Sodium (VOLTAREN) 1 % GEL Apply 1 Application topically 2 (two) times daily as needed (pain).    [provider]  escitalopram (LEXAPRO) 10 MG tablet Take 1 tablet (10 mg total) by mouth daily. 04/27/22   Agapito Games, MD  fluticasone (FLONASE) 50 MCG/ACT nasal spray Place 2 sprays into both nostrils daily. 06/12/22   Eustace Moore, MD  levothyroxine (SYNTHROID) 150 MCG tablet Take 175 mcg by mouth daily. 08/26/21   [provider]  methocarbamol (ROBAXIN) 500 MG tablet Take 1 tablet (500 mg total) by mouth at bedtime as needed for muscle spasms. 04/26/22   Agapito Games, MD  olmesartan-hydrochlorothiazide (BENICAR HCT) 20-12.5 MG tablet TAKE 1 TABLET BY MOUTH EVERY DAY 05/30/22   Agapito Games, MD  pantoprazole (PROTONIX) 40 MG tablet Take 40 mg by mouth 2 (two) times daily. 06/12/21   [provider]  sodium chloride (OCEAN) 0.65 % SOLN nasal spray Place 1 spray into both nostrils as needed for congestion.    [provider]  SUMAtriptan (IMITREX) 100 MG tablet Take 100 mg by mouth every 2 (two) hours as needed for migraine. 06/02/21   [provider]  Vitamin D, Ergocalciferol, (DRISDOL) 1.25 MG (50000 UNIT) CAPS capsule Take  by mouth. 06/02/21   [provider]    Family History Family History  Problem Relation Age of Onset   Arthritis Mother    Hyperlipidemia Mother    Hypertension Mother    Stroke Mother    Mental illness Mother    Arthritis Father    Hyperlipidemia Father    Hypertension Father    Diabetes Father    Hypertension Brother    Heart disease Maternal Grandmother    Stroke Maternal Grandmother    Prostate cancer Paternal Grandfather    Breast cancer Cousin     Social History Social History   Tobacco Use   Smoking status: Never  Vaping Use   Vaping Use: Never used  Substance Use Topics   Alcohol use: No   Drug use: No     Allergies   Azithromycin, Cefdinir, Other, Doxycycline, Erythromycin, Keflex [cephalexin], Latex, Penicillins, Sulfa antibiotics, and Sulfasalazine   Review of Systems Review of Systems  HENT:  Positive for congestion and sore throat.   Respiratory:  Positive for cough.   All other systems reviewed and are negative.    Physical Exam Triage Vital Signs ED Triage Vitals  Enc Vitals Group     BP 08/13/22 1424 123/80     Pulse Rate 08/13/22 1424 87     Resp 08/13/22 1424 18     Temp 08/13/22 1424 98.1 F (36.7 C)     Temp src --      SpO2 08/13/22 1424 98 %     Weight --      Height --      Head Circumference --      Peak Flow --      Pain Score 08/13/22 1423 0     Pain Loc --      Pain Edu? --      Excl. in [place]? --    No data found.  Updated Vital Signs BP 123/80   Pulse 87   Temp 98.1 F (36.7 C)   Resp 18   SpO2 98%    Physical Exam Vitals and nursing note reviewed.  Constitutional:      Appearance: She is well-developed. She is obese. She is ill-appearing.  HENT:     Head: Normocephalic and atraumatic.     Right Ear: Tympanic membrane and external ear normal.     Left Ear: Tympanic membrane and external ear normal.     Ears:     Comments: Significant eustachian dysfunction noted bilaterally  Mouth/Throat:      Mouth: Mucous membranes are moist.     Pharynx: Oropharynx is clear. Uvula midline.     Comments: Significant amount of clear drainage of posterior oropharynx noted Eyes:     Conjunctiva/sclera: Conjunctivae normal.     Pupils: Pupils are equal, round, and reactive to light.  Cardiovascular:     Rate and Rhythm: Normal rate and regular rhythm.     Heart sounds: Normal heart sounds.  Pulmonary:     Effort: Pulmonary effort is normal.     Breath sounds: Normal breath sounds. No wheezing, rhonchi or rales.  Musculoskeletal:     Cervical back: Normal range of motion and neck supple.  Skin:    General: Skin is warm and dry.  Neurological:     General: No focal deficit present.     Mental Status: She is alert and oriented to person, place, and time.  Psychiatric:        Mood and Affect: Mood normal.        Behavior: Behavior normal.      UC Treatments / Results  Labs (all labs ordered are listed, but only abnormal results are displayed) Labs Reviewed - No data to display  EKG   Radiology No results found.  Procedures Procedures (including critical care time)  Medications Ordered in UC Medications - No data to display  Initial Impression / Assessment and Plan / UC Course  I have reviewed the triage vital signs and the nursing notes.  Pertinent labs & imaging results that were available during my care of the patient were reviewed by me and considered in my medical decision making (see chart for details).     MDM: 1.  Chronic maxillary sinusitis, recurrence not specified-Rx'd Levaquin 500 mg daily x 10 days; 2.  Allergic rhinitis, unspecified seasonality, unspecified trigger-Rx'd Allegra 180 mg daily x 5 days, then as needed. Instructed patient to take medication as directed with food to completion.  Advised patient to take Allegra daily with Levaquin for the next 10 days.  Encouraged increase daily water intake to 64 ounces per day while taking this medication.  Advised if  symptoms worsen and/or unresolved please follow-up with PCP or ENT for further evaluation. Final Clinical Impressions(s) / UC Diagnoses   Final diagnoses:  Allergic rhinitis, unspecified seasonality, unspecified trigger  Chronic maxillary sinusitis     Discharge Instructions      Instructed patient to take medication as directed with food to completion.  Advised patient to take Allegra daily with Levaquin for the next 10 days.  Encouraged increase daily water intake to 64 ounces per day while taking this medication.  Advised if symptoms worsen and/or unresolved please follow-up with PCP or ENT for further evaluation.     ED Prescriptions     Medication Sig Dispense Auth. Provider   levofloxacin (LEVAQUIN) 500 MG tablet Take 1 tablet (500 mg total) by mouth daily for 10 days. 10 tablet Trevor Iha, FNP   fexofenadine [facility] ALLERGY) 180 MG tablet Take 1 tablet (180 mg total) by mouth daily for 15 days. 15 tablet Trevor Iha, FNP      PDMP not reviewed this encounter.   Trevor Iha, FNP 08/13/22 1526

## 2022-08-13 NOTE — ED Triage Notes (Signed)
Pt reports she has felt sick since last SAt. Pt is a per-school speech therapist . Pt has a sore throat,productive cough,runny nose . Pt has tried OTC with out relief.

## 2022-09-20 ENCOUNTER — Other Ambulatory Visit: Payer: Self-pay | Admitting: Family Medicine

## 2022-09-20 DIAGNOSIS — F902 Attention-deficit hyperactivity disorder, combined type: Secondary | ICD-10-CM

## 2022-09-23 ENCOUNTER — Telehealth: Payer: Self-pay | Admitting: Family Medicine

## 2022-09-23 DIAGNOSIS — F902 Attention-deficit hyperactivity disorder, combined type: Secondary | ICD-10-CM

## 2022-09-23 MED ORDER — BUPROPION HCL ER (XL) 150 MG PO TB24: 150.0000 mg | ORAL_TABLET | Freq: Every day | ORAL | 0 refills | Status: DC

## 2022-09-23 NOTE — Telephone Encounter (Signed)
Patient is requesting a follow up on her Wellbutrin xl 150mg  She is out and is going out of town  Please advise

## 2022-10-03 ENCOUNTER — Encounter: Payer: Self-pay | Admitting: Family Medicine

## 2022-10-03 ENCOUNTER — Ambulatory Visit (INDEPENDENT_AMBULATORY_CARE_PROVIDER_SITE_OTHER): Admitting: Family Medicine

## 2022-10-03 VITALS — BP 133/68 | HR 64 | Ht 63.0 in | Wt 229.0 lb

## 2022-10-03 DIAGNOSIS — I8393 Asymptomatic varicose veins of bilateral lower extremities: Secondary | ICD-10-CM

## 2022-10-03 DIAGNOSIS — F331 Major depressive disorder, recurrent, moderate: Secondary | ICD-10-CM | POA: Diagnosis not present

## 2022-10-03 DIAGNOSIS — F902 Attention-deficit hyperactivity disorder, combined type: Secondary | ICD-10-CM | POA: Diagnosis not present

## 2022-10-03 DIAGNOSIS — I10 Essential (primary) hypertension: Secondary | ICD-10-CM | POA: Diagnosis not present

## 2022-10-03 MED ORDER — VITAMIN D (ERGOCALCIFEROL) 1.25 MG (50000 UNIT) PO CAPS: 50000.0000 [IU] | ORAL_CAPSULE | ORAL | 0 refills | Status: DC

## 2022-10-03 MED ORDER — OLMESARTAN MEDOXOMIL-HCTZ 20-12.5 MG PO TABS: 1.0000 | ORAL_TABLET | Freq: Every day | ORAL | 3 refills | Status: DC

## 2022-10-03 NOTE — Assessment & Plan Note (Signed)
Continue with Wellbutrin.  We discussed that we always have the option of going up if she would like.  For now since she is not sort of transitioning between jobs she is just going to continue with her current regimen if she is doing well otherwise see her back in 6 months.

## 2022-10-03 NOTE — Assessment & Plan Note (Addendum)
BP  is mildly elevated today but she did not take her medication last time she was here looks fantastic.  Continue current regimen otherwise follow-up in 6 months.  Due for CMP today.

## 2022-10-03 NOTE — Assessment & Plan Note (Signed)
Continue current dose of Lexapro.  Still has a lot of personal stressors.  Just encouraged her to find time to decompress throughout the week.

## 2022-10-03 NOTE — Progress Notes (Addendum)
Established Patient Office Visit  Subjective   Patient ID: [name], female    DOB: 11-05-1970  Age: 52 y.o. MRN: [id number]  Chief Complaint  Patient presents with   mood    HPI  She has been under a lot of stress. Helping take care of her husband who is in a wheelchair and had suffered a traumatic brain injury after having had sepsis.  She is really truly become his primary caretaker.  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   Follow-up ADHD-she has had palpitations with stimulants in the past so we opted to do Wellbutrin.  She has been tolerating it well without any problems or side effects.  She does feel like it is somewhat helpful.  She is not interested in adjusting her dose today.  She says her biggest issue is she still having some problems with hyperfocus where she will get into something and then cannot get off of it.  Hypertension- Pt denies chest pain, SOB, dizziness, or heart palpitations.  Taking meds as directed w/o problems.  Denies medication side effects.   Says she forgot to take her blood pressure medications today.  SHe hass also noticed some varicose veins on her legs.  ROS    Objective:     BP 133/68   Pulse 64   Ht 5\' 3"  (1.6 m)   Wt 229 lb (103.9 kg)   SpO2 99%   BMI 40.57 kg/m    Physical Exam Vitals and nursing note reviewed.  Constitutional:      Appearance: She is well-developed.  HENT:     Head: Normocephalic and atraumatic.  Cardiovascular:     Rate and Rhythm: Normal rate and regular rhythm.     Heart sounds: Normal heart sounds.  Pulmonary:     Effort: Pulmonary effort is normal.     Breath sounds: Normal breath sounds.  Skin:    General: Skin is warm and dry.  Neurological:     Mental Status: She is alert and oriented to person, place, and  time.  Psychiatric:        Behavior: Behavior normal.      No results found for any visits on 10/03/22.    The 10-year ASCVD risk score (Arnett DK, et al., 2019) is: 4.9%    Assessment & Plan:   Problem List Items Addressed This Visit       Cardiovascular and Mediastinum   Essential hypertension - Primary    BP  is mildly elevated today but she did not take her medication last time she was here looks fantastic.  Continue current regimen otherwise follow-up in 6 months.  Due for CMP today.      Relevant Medications   olmesartan-hydrochlorothiazide (BENICAR HCT) 20-12.5 MG tablet   Asymptomatic varicose veins of both lower extremities    Some spider veins on both inner knees.  Discussed compression stockings with a pressure of 15-20 either above the knee or thigh-high.  If she starts developing pain or discomfort we can always refer to vein and vascular clinic.      Relevant Medications   olmesartan-hydrochlorothiazide (BENICAR HCT) 20-12.5 MG tablet     Other   Major depressive disorder, recurrent episode, moderate (HCC)    Continue current dose of Lexapro.  Still has a lot of personal stressors.  Just encouraged her to find time to decompress throughout the week.      ADHD    Continue with Wellbutrin.  We discussed that we always have the option of going up if she would like.  For now since she is not sort of transitioning between jobs she is just going to continue with her current regimen if she is doing well otherwise see her back in 6 months.       Get labs today she needs potassium since starting the HCTZ and we also need to recheck her liver function.  Return in about 6 months (around 04/04/2023) for Mood.    Nani Gasser, MD

## 2022-10-03 NOTE — Assessment & Plan Note (Signed)
Some spider veins on both inner knees.  Discussed compression stockings with a pressure of 15-20 either above the knee or thigh-high.  If she starts developing pain or discomfort we can always refer to vein and vascular clinic.

## 2022-10-04 ENCOUNTER — Encounter: Payer: Self-pay | Admitting: Family Medicine

## 2022-10-04 DIAGNOSIS — R748 Abnormal levels of other serum enzymes: Secondary | ICD-10-CM

## 2022-10-04 NOTE — Progress Notes (Signed)
Hi Lori,  One of your liver enzymes is up a little.  Similar to what it was previously but a little bit better this time.  Sometimes there can be a small bump in liver enzymes from products like Tylenol and sometimes even just diet.  It can cause some inflammation.  I definitely want to keep a close eye on this.  Any prior history of hepatitis?  Please call lab and see if we can add a acute hepatitis panel.

## 2022-10-05 ENCOUNTER — Other Ambulatory Visit: Payer: Self-pay

## 2022-10-05 DIAGNOSIS — R748 Abnormal levels of other serum enzymes: Secondary | ICD-10-CM

## 2022-10-05 LAB — COMPLETE METABOLIC PANEL WITH GFR
AG Ratio: 2 (calc) (ref 1.0–2.5)
ALT: 53 U/L — ABNORMAL HIGH (ref 6–29)
AST: 34 U/L (ref 10–35)
Albumin: 4.6 g/dL (ref 3.6–5.1)
Alkaline phosphatase (APISO): 92 U/L (ref 37–153)
BUN: 14 mg/dL (ref 7–25)
CO2: 27 mmol/L (ref 20–32)
Calcium: 9.5 mg/dL (ref 8.6–10.4)
Chloride: 102 mmol/L (ref 98–110)
Creat: 0.92 mg/dL (ref 0.50–1.03)
Globulin: 2.3 g/dL (calc) (ref 1.9–3.7)
Glucose, Bld: 82 mg/dL (ref 65–99)
Potassium: 4 mmol/L (ref 3.5–5.3)
Sodium: 141 mmol/L (ref 135–146)
Total Bilirubin: 0.4 mg/dL (ref 0.2–1.2)
Total Protein: 6.9 g/dL (ref 6.1–8.1)
eGFR: 74 mL/min/{1.73_m2} (ref >=60)

## 2022-10-05 LAB — ACUTE HEP PANEL AND HEP B SURFACE AB

## 2022-10-05 NOTE — Progress Notes (Signed)
Lori, they did not have enough blood to run the acute hepatitis panel.  So I would love to recheck your liver function and hepatitis panel in about 6 to 8 weeks if that is okay.

## 2022-10-19 LAB — HM MAMMOGRAPHY

## 2022-11-10 ENCOUNTER — Encounter: Payer: Self-pay | Admitting: Family Medicine

## 2022-12-21 ENCOUNTER — Other Ambulatory Visit: Payer: Self-pay | Admitting: Family Medicine

## 2022-12-21 DIAGNOSIS — F331 Major depressive disorder, recurrent, moderate: Secondary | ICD-10-CM

## 2022-12-21 DIAGNOSIS — F411 Generalized anxiety disorder: Secondary | ICD-10-CM

## 2023-01-16 ENCOUNTER — Other Ambulatory Visit: Payer: Self-pay | Admitting: Physician Assistant

## 2023-01-16 DIAGNOSIS — F902 Attention-deficit hyperactivity disorder, combined type: Secondary | ICD-10-CM

## 2023-02-02 ENCOUNTER — Ambulatory Visit (INDEPENDENT_AMBULATORY_CARE_PROVIDER_SITE_OTHER): Admitting: Family Medicine

## 2023-02-02 VITALS — BP 115/66 | HR 71 | Ht 63.0 in | Wt 238.0 lb

## 2023-02-02 DIAGNOSIS — M2550 Pain in unspecified joint: Secondary | ICD-10-CM | POA: Diagnosis not present

## 2023-02-02 DIAGNOSIS — M7062 Trochanteric bursitis, left hip: Secondary | ICD-10-CM

## 2023-02-02 DIAGNOSIS — R635 Abnormal weight gain: Secondary | ICD-10-CM | POA: Diagnosis not present

## 2023-02-02 DIAGNOSIS — M542 Cervicalgia: Secondary | ICD-10-CM | POA: Diagnosis not present

## 2023-02-02 DIAGNOSIS — Z6841 Body Mass Index (BMI) 40.0 and over, adult: Secondary | ICD-10-CM

## 2023-02-02 DIAGNOSIS — M7061 Trochanteric bursitis, right hip: Secondary | ICD-10-CM

## 2023-02-02 MED ORDER — MELOXICAM 15 MG PO TABS: 15.0000 mg | ORAL_TABLET | Freq: Every day | ORAL | 1 refills | Status: DC

## 2023-02-02 NOTE — Assessment & Plan Note (Signed)
MI 42-she is considering starting one of the newer weight loss medications.  She just already struggles with constipation so she knows she have to be more active and taking something to keep her bowels moving.  Right now we discussed maybe dropping the Wellbutrin away and taking it every other day for a week and then stopping it for few weeks to see if that is helpful she is just noticed that ever since she started the medication that her appetite has really ramped up.

## 2023-02-02 NOTE — Progress Notes (Signed)
Acute Office Visit  Subjective:     Patient ID: [name], female    DOB: 1971/02/14, 52 y.o.   MRN: [id number]  Chief Complaint  Patient presents with   Back Pain         HPI Patient is in today for neck pain, Had a bad fall last year where passed out and fell to the left.  Pain is along upper outer cervical spine.  Muscle relaxer helps some.  Never did PT.  Had MRI and xray at [facility] around that time.  They said she some pretty severe arthritis.  In fact she was told to never see a chiropractor because they could actually cause some damage and create some complications.  She says she just gotten to the point where her pain has gotten much worse.  She is very proactive in trying to treat her pain she occasionally wears a soft brace especially at the end of the day she has a special pillow that she sleeps with to provide support.  She has a shepherd's hook that she will sometimes try to work on the knots in her muscle.    She also reports significant pain in her hips and her joints.  She knows she has gained some weight back since doing [employer] and wonders if that could be contributing.  It is painful to turn over and sleep on her hips  He wants to discuss her weight as well.  She is now working from home.  ROS      Objective:    BP 115/66   Pulse 71   Ht 5\' 3"  (1.6 m)   Wt 238 lb (108 kg)   SpO2 99%   BMI 42.16 kg/m    Physical Exam Vitals and nursing note reviewed.  Constitutional:      Appearance: Normal appearance.  HENT:     Head: Normocephalic and atraumatic.  Eyes:     Conjunctiva/sclera: Conjunctivae normal.  Cardiovascular:     Rate and Rhythm: Normal rate and regular rhythm.  Pulmonary:     Effort: Pulmonary effort is normal.     Breath sounds: Normal breath sounds.  Skin:    General: Skin is warm and dry.  Neurological:     Mental Status: She is alert.  Psychiatric:        Mood and Affect: Mood normal.     No results found for any visits  on 02/02/23.      Assessment & Plan:   Problem List Items Addressed This Visit       Musculoskeletal and Integument   Trochanteric bursitis of both hips    Discussed inflammation of the bursa.  Given handout for stretches to do on her own at home if not improving over the next 3 to 4 weeks we could consider injections for relief.  I do think weight loss would be helpful and that is something she is wanting to improve.        Other   Polyarthralgia    We did discuss cutting out sugar completely as much as possible.  Of had quite a few patients really notice a big improvement in inflammation in her joints.  I do think continuing to work on the weight loss would be really powerful as well she also uses Voltaren gel as needed and Tylenol.      Cervical pain   BMI 40.0-44.9, adult (HCC)    MI 42-she is considering starting one of the newer weight loss  medications.  She just already struggles with constipation so she knows she have to be more active and taking something to keep her bowels moving.  Right now we discussed maybe dropping the Wellbutrin away and taking it every other day for a week and then stopping it for few weeks to see if that is helpful she is just noticed that ever since she started the medication that her appetite has really ramped up.      Other Visit Diagnoses     Abnormal weight gain    -  Primary   Relevant Orders   TSH   Hemoglobin A1c       Meds ordered this encounter  Medications   meloxicam (MOBIC) 15 MG tablet    Sig: Take 1 tablet (15 mg total) by mouth daily.    Dispense:  30 tablet    Refill:  1    No follow-ups on file.  Nani Gasser, MD

## 2023-02-02 NOTE — Assessment & Plan Note (Signed)
We did discuss cutting out sugar completely as much as possible.  Of had quite a few patients really notice a big improvement in inflammation in her joints.  I do think continuing to work on the weight loss would be really powerful as well she also uses Voltaren gel as needed and Tylenol.

## 2023-02-02 NOTE — Assessment & Plan Note (Signed)
Discussed inflammation of the bursa.  Given handout for stretches to do on her own at home if not improving over the next 3 to 4 weeks we could consider injections for relief.  I do think weight loss would be helpful and that is something she is wanting to improve.

## 2023-02-02 NOTE — Patient Instructions (Signed)
Please schedule with Dr. Karie Schwalbe here in our office for your neck

## 2023-02-03 ENCOUNTER — Encounter: Payer: Self-pay | Admitting: Family Medicine

## 2023-02-03 LAB — HEMOGLOBIN A1C
Est. average glucose Bld gHb Est-mCnc: 148 mg/dL
Hgb A1c MFr Bld: 6.8 % — ABNORMAL HIGH (ref 4.8–5.6)

## 2023-02-03 LAB — TSH: TSH: 0.097 u[IU]/mL — ABNORMAL LOW (ref 0.450–4.500)

## 2023-02-03 MED ORDER — TIRZEPATIDE 2.5 MG/0.5ML ~~LOC~~ SOAJ: 2.5000 mg | SUBCUTANEOUS | 0 refills | Status: DC

## 2023-02-03 NOTE — Telephone Encounter (Signed)
Pls forward results

## 2023-02-03 NOTE — Telephone Encounter (Signed)
Usually recommend miralax anywhere form twice a week to daily at bedtime depending on bowels.

## 2023-02-03 NOTE — Telephone Encounter (Signed)
Can we start a PA?  Thank you  Meds ordered this encounter  Medications   tirzepatide (MOUNJARO) 2.5 MG/0.5ML Pen    Sig: Inject 2.5 mg into the skin once a week.    Dispense:  2 mL    Refill:  0   Also please call lab and see if we can add on an acute hepatitis panel to her blood work that was just drawn.  Today I tried to draw it a few weeks ago and did not have an of blood and she came back for blood work yesterday but it was not on the panel.

## 2023-02-03 NOTE — Progress Notes (Signed)
Hi [name], the TSH is little overly suppressed which means you are slightly overmedicated.  I am happy to forward this to your endocrinologist if you would like or if you want to send them a MyChart note that is fine.  But they might want to make a slight adjustment to your regimen.  A1c is up to 6.8 which is actually in the diabetes range anything greater than 6.4 by definition is diabetes.  Know we had talked about some of the medications that are used for weight loss but we could consider using the diabetes version and it would probably be well covered by her insurance if you would like to try that to help reduce your A1c as well as reduce your weight.  We can always start low and go slow and that way you can be proactive with your bowels.  Just let me know what you would like to consider doing.  I definitely want to schedule a follow-up with you in about 6 to 8 weeks instead of 6 months so that we can make sure that we are getting on track with the blood sugars.

## 2023-02-03 NOTE — Telephone Encounter (Signed)
Task completed. Per provider's request, recent TSH results routed to endocrinologist via Epic.

## 2023-02-04 LAB — ACUTE VIRAL HEPATITIS (HAV, HBV, HCV)
HCV Ab: NONREACTIVE
Hep A IgM: NEGATIVE
Hep B C IgM: NEGATIVE
Hepatitis B Surface Ag: NEGATIVE

## 2023-02-04 LAB — HCV INTERPRETATION

## 2023-02-04 LAB — HEPATITIS B SURFACE ANTIBODY,QUALITATIVE: Hep B Surface Ab, Qual: REACTIVE

## 2023-02-04 LAB — SPECIMEN STATUS REPORT

## 2023-02-06 NOTE — Progress Notes (Signed)
Negative for Hep A, B, and C.

## 2023-02-14 ENCOUNTER — Ambulatory Visit: Admitting: Sports Medicine

## 2023-02-24 ENCOUNTER — Ambulatory Visit (INDEPENDENT_AMBULATORY_CARE_PROVIDER_SITE_OTHER): Admitting: Sports Medicine

## 2023-02-24 ENCOUNTER — Ambulatory Visit

## 2023-02-24 ENCOUNTER — Encounter: Payer: Self-pay | Admitting: Sports Medicine

## 2023-02-24 DIAGNOSIS — M5416 Radiculopathy, lumbar region: Secondary | ICD-10-CM

## 2023-02-24 DIAGNOSIS — Z6841 Body Mass Index (BMI) 40.0 and over, adult: Secondary | ICD-10-CM | POA: Diagnosis not present

## 2023-02-24 DIAGNOSIS — M5030 Other cervical disc degeneration, unspecified cervical region: Secondary | ICD-10-CM | POA: Insufficient documentation

## 2023-02-24 MED ORDER — METHOCARBAMOL 500 MG PO TABS: 500.0000 mg | ORAL_TABLET | Freq: Two times a day (BID) | ORAL | 3 refills | Status: DC

## 2023-02-24 MED ORDER — PREDNISONE 50 MG PO TABS: ORAL_TABLET | ORAL | 0 refills | Status: DC

## 2023-02-24 MED ORDER — MELOXICAM 15 MG PO TABS: ORAL_TABLET | ORAL | 3 refills | Status: DC

## 2023-02-24 NOTE — Assessment & Plan Note (Signed)
Morbid obesity with a very high BMI. Multiple comorbidities including diabetes, degenerative disc disease, degenerative joint disease. At this point it sounds like nothing is working including diet, exercise, medication, we will refer her to bariatric surgery, she does need to lose a lot of weight I think this is her best chance.

## 2023-02-24 NOTE — Assessment & Plan Note (Signed)
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 She has done some stretching and has had some dry needling but it sounds like no overt physical therapy. I explained her the anatomy and pathophysiology of lumbar and cervical disc disease. We will do 5 days of prednisone to get her out of pain, home physical therapy, meloxicam and methocarbamol, she does tell me that when on methocarbamol her pain is minimal throughout her body and she is able to function.

## 2023-02-24 NOTE — Progress Notes (Signed)
    Procedures performed today:    None.  Independent interpretation of notes and tests performed by another provider:   None.  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 She has done some stretching and has had some dry needling but it sounds like no overt physical therapy. I explained her the anatomy and pathophysiology of lumbar and cervical disc disease. We will do 5 days of prednisone to get her out of pain, home physical therapy, meloxicam and methocarbamol, she does tell me that when on methocarbamol her pain is minimal throughout her body and she is able to function.   Left lumbar radiculopathy As above we will treat conservatively, home physical therapy, x-rays, prednisone, Mobic, methocarbamol. Return to see me in 6 weeks, MR for interventional planning if no better.  BMI 40.0-44.9, adult (HCC) Morbid obesity with a very high BMI. Multiple comorbidities including diabetes, degenerative disc disease, degenerative joint disease. At this point it sounds like nothing is working including diet, exercise, medication, we will refer her to bariatric surgery, she does need to lose a lot of weight I think this is her best chance.    ____________________________________________ Ihor Austin. Benjamin Stain, M.D., ABFM., CAQSM., AME. Primary Care and Sports Medicine [facility]  Adjunct Professor of Family Medicine  [employer]  Restaurant manager, fast food

## 2023-02-24 NOTE — Assessment & Plan Note (Signed)
As above we will treat conservatively, home physical therapy, x-rays, prednisone, Mobic, methocarbamol. Return to see me in 6 weeks, MR for interventional planning if no better.

## 2023-02-27 ENCOUNTER — Encounter: Payer: Self-pay | Admitting: Sports Medicine

## 2023-02-27 ENCOUNTER — Encounter: Payer: Self-pay | Admitting: Family Medicine

## 2023-02-28 NOTE — Telephone Encounter (Signed)
It looks like Vanicia route on 10/25.  Maybe we can resend and ask provider to contact pt.   I understand since med is veryexpense

## 2023-04-07 ENCOUNTER — Ambulatory Visit: Admitting: Sports Medicine

## 2023-05-18 ENCOUNTER — Encounter: Payer: Self-pay | Admitting: Family Medicine

## 2023-05-18 ENCOUNTER — Telehealth: Admitting: Physician Assistant

## 2023-05-18 DIAGNOSIS — Z20828 Contact with and (suspected) exposure to other viral communicable diseases: Secondary | ICD-10-CM | POA: Diagnosis not present

## 2023-05-18 DIAGNOSIS — R6889 Other general symptoms and signs: Secondary | ICD-10-CM | POA: Diagnosis not present

## 2023-05-18 MED ORDER — OSELTAMIVIR PHOSPHATE 75 MG PO CAPS: 75.0000 mg | ORAL_CAPSULE | Freq: Two times a day (BID) | ORAL | 0 refills | Status: DC

## 2023-05-18 MED ORDER — OSELTAMIVIR PHOSPHATE 75 MG PO CAPS: 75.0000 mg | ORAL_CAPSULE | Freq: Two times a day (BID) | ORAL | 0 refills | Status: AC

## 2023-05-18 NOTE — Progress Notes (Signed)

## 2023-05-18 NOTE — Progress Notes (Signed)
 I have spent 5 minutes in review of e-visit questionnaire, review and updating patient chart, medical decision making and response to patient.   Piedad Climes, PA-C

## 2023-06-04 ENCOUNTER — Telehealth: Admitting: Family

## 2023-06-04 DIAGNOSIS — J019 Acute sinusitis, unspecified: Secondary | ICD-10-CM | POA: Diagnosis not present

## 2023-06-04 MED ORDER — LEVOFLOXACIN 500 MG PO TABS: 500.0000 mg | ORAL_TABLET | Freq: Every day | ORAL | 0 refills | Status: AC

## 2023-06-04 NOTE — Progress Notes (Signed)
E-Visit for Sinus Problems  We are sorry that you are not feeling well.  Here is how we plan to help!  Based on what you have shared with me it looks like you have sinusitis.  Sinusitis is inflammation and infection in the sinus cavities of the head.  Based on your presentation I believe you most likely have Acute Bacterial Sinusitis.  This is an infection caused by bacteria and is treated with antibiotics. I have prescribed Levofloxicin 500mg by mouth once daily for 7 days. You may use an oral decongestant such as Mucinex D or if you have glaucoma or high blood pressure use plain Mucinex. Saline nasal spray help and can safely be used as often as needed for congestion.  If you develop worsening sinus pain, fever or notice severe headache and vision changes, or if symptoms are not better after completion of antibiotic, please schedule an appointment with a health care provider.    Sinus infections are not as easily transmitted as other respiratory infection, however we still recommend that you avoid close contact with loved ones, especially the very young and elderly.  Remember to wash your hands thoroughly throughout the day as this is the number one way to prevent the spread of infection!  Home Care: Only take medications as instructed by your medical team. Complete the entire course of an antibiotic. Do not take these medications with alcohol. A steam or ultrasonic humidifier can help congestion.  You can place a towel over your head and breathe in the steam from hot water coming from a faucet. Avoid close contacts especially the very young and the elderly. Cover your mouth when you cough or sneeze. Always remember to wash your hands.  Get Help Right Away If: You develop worsening fever or sinus pain. You develop a severe head ache or visual changes. Your symptoms persist after you have completed your treatment plan.  Make sure you Understand these instructions. Will watch your  condition. Will get help right away if you are not doing well or get worse.  Thank you for choosing an e-visit.  Your e-visit answers were reviewed by a board certified advanced clinical practitioner to complete your personal care plan. Depending upon the condition, your plan could have included both over the counter or prescription medications.  Please review your pharmacy choice. Make sure the pharmacy is open so you can pick up prescription now. If there is a problem, you may contact your provider through MyChart messaging and have the prescription routed to another pharmacy.  Your safety is important to us. If you have drug allergies check your prescription carefully.   For the next 24 hours you can use MyChart to ask questions about today's visit, request a non-urgent call back, or ask for a work or school excuse. You will get an email in the next two days asking about your experience. I hope that your e-visit has been valuable and will speed your recovery.  Approximately 5 minutes was spent documenting and reviewing patient's chart.    

## 2023-07-10 ENCOUNTER — Ambulatory Visit: Payer: Self-pay

## 2023-07-10 NOTE — Telephone Encounter (Signed)
 Patient called in stating she would like an appt with PCP for change of blood pressure medication. Patient did not want to discuss triage, but states that she has been very stressed out at work recently, her ADHD has been worsened and she is having trouble focusing. Patient notices she has been having random racing heart, and states "I admit I have not been taking my blood pressure medication like I should. But... when I do take it, I immediately feel exhausted and depressed and bad. So I want an appt with my doctor to change my BP medication". Upon further assessment, patient did not want to fully be triaged but stated she need appt for week of April 7th since that is her spring break. This RN attempted to ask patient if she takes BP at home to monitor and she states she is too stressed out. This RN explained importance of a blood pressure log until her appt and patient verbalized understanding. Advised patient to call back immediately with chest pain or cardiac symptoms.    Copied from CRM [id number]. Topic: Clinical - Red Word Triage >> Jul 10, 2023  4:21 PM Antwanette L wrote: Red Word that prompted transfer to Nurse Triage:  Patient is not feeling well. Heart has been racing.  When taking the medicine it makes you tired and depressed. Reason for Disposition  [1] Caller mainly has complaints about past medical care, billing, etc. AND [2] has mild symptoms or is well  Answer Assessment - Initial Assessment Questions 1. SITUATION:  Document reason for call.     Patent wants appt with PCP to get on a different blood pressure medication 2. BACKGROUND: Document any background information (e.g., prior calls, known psychiatric history)     N/a 3. ASSESSMENT: Document your nursing assessment.     See Notes 4. RESPONSE: Document what your response or recommendation was.     Appt made for earliest available appt that meets patient schedule with PCP  Protocols used: Difficult Call-A-AH

## 2023-07-17 ENCOUNTER — Encounter: Payer: Self-pay | Admitting: Family Medicine

## 2023-07-17 ENCOUNTER — Ambulatory Visit (INDEPENDENT_AMBULATORY_CARE_PROVIDER_SITE_OTHER): Admitting: Family Medicine

## 2023-07-17 VITALS — BP 119/76 | HR 79 | Ht 65.0 in | Wt 249.5 lb

## 2023-07-17 DIAGNOSIS — I10 Essential (primary) hypertension: Secondary | ICD-10-CM | POA: Diagnosis not present

## 2023-07-17 DIAGNOSIS — F411 Generalized anxiety disorder: Secondary | ICD-10-CM | POA: Diagnosis not present

## 2023-07-17 DIAGNOSIS — R519 Headache, unspecified: Secondary | ICD-10-CM

## 2023-07-17 DIAGNOSIS — E890 Postprocedural hypothyroidism: Secondary | ICD-10-CM | POA: Diagnosis not present

## 2023-07-17 DIAGNOSIS — R7309 Other abnormal glucose: Secondary | ICD-10-CM | POA: Diagnosis not present

## 2023-07-17 MED ORDER — LEVOTHYROXINE SODIUM 150 MCG PO TABS: 175.0000 ug | ORAL_TABLET | Freq: Every day | ORAL | 1 refills | Status: AC

## 2023-07-17 MED ORDER — LOSARTAN POTASSIUM 25 MG PO TABS: 25.0000 mg | ORAL_TABLET | Freq: Every evening | ORAL | 0 refills | Status: DC

## 2023-07-17 MED ORDER — SUMATRIPTAN SUCCINATE 100 MG PO TABS: 100.0000 mg | ORAL_TABLET | ORAL | 99 refills | Status: AC | PRN

## 2023-07-17 MED ORDER — HYDROCHLOROTHIAZIDE 12.5 MG PO CAPS: 12.5000 mg | ORAL_CAPSULE | Freq: Every morning | ORAL | 3 refills | Status: AC

## 2023-07-17 NOTE — Assessment & Plan Note (Addendum)
 Pressure looks fantastic today, but she does not feel great on her current regimen.  Will switch to a different ARB and see if that helps she would like to keep her diuretic she has been on that for a long time.  So we also discussed splitting the medication apart and maybe taking a diuretic in the morning and taking the ARB at night that way we can see what might be making her feel bad and maybe just the combination hitting all at 1 time that makes her feel bad.   call if any problems.

## 2023-07-17 NOTE — Assessment & Plan Note (Signed)
 HQ 9 score 13 and GAD-7 score of 10 today..  Hoping stress levels come down and may when things will get better at work.  Did encourage her to keep an eye on it and let me know if she feels like we need to do anything different or if she needs more help.

## 2023-07-17 NOTE — Assessment & Plan Note (Signed)
 I am happy to take over refills on her Imitrex.  Will update and send a new prescription to the pharmacy.  From our records it looks like she is on the 100 mg tablet.

## 2023-07-17 NOTE — Progress Notes (Signed)
 Acute Office Visit  Subjective:     Patient ID: [name], female    DOB: 07/19/1970, 53 y.o.   MRN: [id number]  Chief Complaint  Patient presents with   Medical Management of Chronic Issues    BP med f/u olmesartan-hydrochlorothiazide pt states she if having pain, and feels very drained after taking the med    HPI  Patient is in today for blood pressure.  She says when she takes her BP med she immediately feels depressed, fatigued and sad so hasn't been taking it regularly.  Says it hits her like a brick wall about an hour after she takes it.  Work has been really stressful she feels like her ADD has been a little bit worse because of the stress harder to focus.  Has been having a few more palpitations lately.  Current blood pressure pills and losartan/HCTZ.   Been doing virtual speech therapy and the caseload is extremely high she feels unfocused she is not sleeping well she has shared her stress with her boss.  Her current contract goes until May so she is just trying to count her by her time until then.  She is just having a hard time turning things off and just feels more anxious and on edge.  Also fell on [place] today and rolled her right ankle she stepped on a ball and it twisted.  She says it is getting a little better but it still inflamed and sore.  ROS      Objective:    BP 119/76 (BP Location: Left Arm, Patient Position: Sitting, Cuff Size: Large)   Pulse 79   Ht 5\' 5"  (1.651 m)   Wt 249 lb 8 oz (113.2 kg)   SpO2 95%   BMI 41.52 kg/m    Physical Exam Vitals and nursing note reviewed.  Constitutional:      Appearance: Normal appearance.  HENT:     Head: Normocephalic and atraumatic.  Eyes:     Conjunctiva/sclera: Conjunctivae normal.  Cardiovascular:     Rate and Rhythm: Normal rate and regular rhythm.  Pulmonary:     Effort: Pulmonary effort is normal.     Breath sounds: Normal breath sounds.  Skin:    General: Skin is warm and dry.   Neurological:     Mental Status: She is alert.  Psychiatric:        Mood and Affect: Mood normal.     No results found for any visits on 07/17/23.      Assessment & Plan:   Problem List Items Addressed This Visit       Cardiovascular and Mediastinum   Essential hypertension   Pressure looks fantastic today, but she does not feel great on her current regimen.  Will switch to a different ARB and see if that helps she would like to keep her diuretic she has been on that for a long time.  So we also discussed splitting the medication apart and maybe taking a diuretic in the morning and taking the ARB at night that way we can see what might be making her feel bad and maybe just the combination hitting all at 1 time that makes her feel bad.   call if any problems.      Relevant Medications   losartan (COZAAR) 25 MG tablet   hydrochlorothiazide (MICROZIDE) 12.5 MG capsule   Other Relevant Orders   CMP14+EGFR   Hemoglobin A1c     Endocrine   Hypothyroid   Relevant  Medications   levothyroxine (SYNTHROID) 150 MCG tablet   Other Relevant Orders   TSH + free T4   T3, free     Other   Mixed headache   I am happy to take over refills on her Imitrex.  Will update and send a new prescription to the pharmacy.  From our records it looks like she is on the 100 mg tablet.      Relevant Medications   SUMAtriptan (IMITREX) 100 MG tablet   Generalized anxiety disorder - Primary   HQ 9 score 13 and GAD-7 score of 10 today..  Hoping stress levels come down and may when things will get better at work.  Did encourage her to keep an eye on it and let me know if she feels like we need to do anything different or if she needs more help.      Other Visit Diagnoses       Abnormal glucose       Relevant Orders   Hemoglobin A1c      Not examine her right ankle today but we can always get her in with our sports med doc if it does not continue to improve.  Meds ordered this encounter   Medications   SUMAtriptan (IMITREX) 100 MG tablet    Sig: Take 1 tablet (100 mg total) by mouth every 2 (two) hours as needed for migraine.    Dispense:  10 tablet    Refill:  PRN   levothyroxine (SYNTHROID) 150 MCG tablet    Sig: Take 1 tablet (150 mcg total) by mouth daily.    Dispense:  90 tablet    Refill:  1   losartan (COZAAR) 25 MG tablet    Sig: Take 1 tablet (25 mg total) by mouth at bedtime.    Dispense:  90 tablet    Refill:  0   hydrochlorothiazide (MICROZIDE) 12.5 MG capsule    Sig: Take 1 capsule (12.5 mg total) by mouth in the morning.    Dispense:  90 capsule    Refill:  3    Return in about 6 weeks (around 08/28/2023) for Nurse BP Check.  Nani Gasser, MD

## 2023-07-18 ENCOUNTER — Other Ambulatory Visit: Payer: Self-pay | Admitting: Family Medicine

## 2023-07-18 ENCOUNTER — Encounter: Payer: Self-pay | Admitting: Family Medicine

## 2023-07-18 DIAGNOSIS — E118 Type 2 diabetes mellitus with unspecified complications: Secondary | ICD-10-CM

## 2023-07-18 DIAGNOSIS — R748 Abnormal levels of other serum enzymes: Secondary | ICD-10-CM

## 2023-07-18 LAB — CMP14+EGFR
ALT: 88 IU/L — ABNORMAL HIGH (ref 0–32)
AST: 46 IU/L — ABNORMAL HIGH (ref 0–40)
Albumin: 4.5 g/dL (ref 3.8–4.9)
Alkaline Phosphatase: 115 IU/L (ref 44–121)
BUN/Creatinine Ratio: 15 (ref 9–23)
BUN: 15 mg/dL (ref 6–24)
Bilirubin Total: 0.4 mg/dL (ref 0.0–1.2)
CO2: 24 mmol/L (ref 20–29)
Calcium: 9.2 mg/dL (ref 8.7–10.2)
Chloride: 100 mmol/L (ref 96–106)
Creatinine, Ser: 1 mg/dL (ref 0.57–1.00)
Globulin, Total: 2.3 g/dL (ref 1.5–4.5)
Glucose: 133 mg/dL — ABNORMAL HIGH (ref 70–99)
Potassium: 4.1 mmol/L (ref 3.5–5.2)
Sodium: 140 mmol/L (ref 134–144)
Total Protein: 6.8 g/dL (ref 6.0–8.5)
eGFR: 67 mL/min/{1.73_m2} (ref >59)

## 2023-07-18 LAB — HEMOGLOBIN A1C
Est. average glucose Bld gHb Est-mCnc: 157 mg/dL
Hgb A1c MFr Bld: 7.1 % — ABNORMAL HIGH (ref 4.8–5.6)

## 2023-07-18 LAB — TSH+FREE T4
Free T4: 1.54 ng/dL (ref 0.82–1.77)
TSH: 0.144 u[IU]/mL — ABNORMAL LOW (ref 0.450–4.500)

## 2023-07-18 LAB — T3, FREE: T3, Free: 2.8 pg/mL (ref 2.0–4.4)

## 2023-07-18 NOTE — Progress Notes (Signed)
 Hi [name], your ALT liver enzyme that was slightly elevated back over the summer has actually trended up and now your AST is also elevated.  I like to get an ultrasound of your liver scheduled.  We will call the lab and see if we can add an acute hepatitis panel.  Your A1c also went up to 7.1 compared to 5 months ago.  Really need to start you on something for your diabetes.  We got to get your sugars under better control.  I know we have previously talked about maybe even one of the GLP-1's or at least starting with metformin which is considered first-line therapy for diabetes.  It does not cause weight gain and can improve your glucose levels.  We even use it in women who have PCOS who do not even have diabetes.  So it is a safe medication to use.  But we need to do something to get this back down.    That could be contributing to the bump in liver enzymes as well.  Your thyroid level is still a little overly suppressed but it does look better than it did 5 months ago.  I would like for you to take a half of a tab on Sundays and a whole tab the other 6 days a week and then repeat that pattern weekly.  Then plan to recheck your level again in about 8 weeks.

## 2023-07-19 ENCOUNTER — Other Ambulatory Visit: Payer: Self-pay | Admitting: Family Medicine

## 2023-07-19 MED ORDER — TIRZEPATIDE 2.5 MG/0.5ML ~~LOC~~ SOAJ: 2.5000 mg | SUBCUTANEOUS | 0 refills | Status: DC

## 2023-07-19 NOTE — Telephone Encounter (Signed)
 PA required for [facility]. Thanks in advance.

## 2023-07-19 NOTE — Telephone Encounter (Signed)
Meds ordered this encounter  Medications   tirzepatide (MOUNJARO) 2.5 MG/0.5ML Pen    Sig: Inject 2.5 mg into the skin once a week.    Dispense:  2 mL    Refill:  0

## 2023-07-19 NOTE — Telephone Encounter (Signed)
 Dr. Linford Arnold,  Please see mychart message sent by pt and advise.

## 2023-07-26 ENCOUNTER — Telehealth: Payer: Self-pay

## 2023-07-26 ENCOUNTER — Other Ambulatory Visit (HOSPITAL_COMMUNITY): Payer: Self-pay

## 2023-07-26 NOTE — Telephone Encounter (Signed)
 Insurance companies are becoming increasingly stricter about requiring thorough documentation of lifestyle modifications in the patient's chart at each visit. This includes detailed records of diet recommendations (caloric intake, etc), exercise plans (amount of time/wk, etc), and an emphasis on the patient's commitment to continuing these efforts while on medication.  Without this additional documentation in the chart notes, a prior authorization will most likely be denied. Test billing says there is a diagnosis mismatch.

## 2023-07-28 ENCOUNTER — Other Ambulatory Visit (HOSPITAL_COMMUNITY): Payer: Self-pay

## 2023-07-28 NOTE — Telephone Encounter (Signed)
 Medication is not covered for their diagnosis.

## 2023-07-31 DIAGNOSIS — E118 Type 2 diabetes mellitus with unspecified complications: Secondary | ICD-10-CM | POA: Insufficient documentation

## 2023-07-31 MED ORDER — TIRZEPATIDE 2.5 MG/0.5ML ~~LOC~~ SOAJ
2.5000 mg | SUBCUTANEOUS | 0 refills | Status: DC
Start: 2023-07-31 — End: 2023-09-13

## 2023-07-31 MED ORDER — TIRZEPATIDE 2.5 MG/0.5ML ~~LOC~~ SOAJ: 2.5000 mg | SUBCUTANEOUS | 0 refills | Status: DC

## 2023-07-31 NOTE — Telephone Encounter (Signed)
 PA auth for Mounjaro is denied due to the associated diagnosis. Patient updated via MyChart msg.

## 2023-07-31 NOTE — Addendum Note (Signed)
 Addended by: Damiya Sandefur D on: 07/31/2023 04:03 PM   Modules accepted: Orders

## 2023-08-01 ENCOUNTER — Other Ambulatory Visit (HOSPITAL_COMMUNITY): Payer: Self-pay

## 2023-08-01 ENCOUNTER — Encounter: Payer: Self-pay | Admitting: Family Medicine

## 2023-08-01 ENCOUNTER — Telehealth: Payer: Self-pay

## 2023-08-01 NOTE — Telephone Encounter (Signed)
 Prior Authorization form/request asks a question that requires your assistance. Please see the question below and advise accordingly. The PA will not be submitted until the necessary information is received. I spoke with [employer] and they require a Letter of Medical Necessity to be faxed to [phone] ATTN: [facility] Program OPS

## 2023-08-01 NOTE — Telephone Encounter (Signed)
 Letter has been faxed to Fax [phone].

## 2023-08-01 NOTE — Telephone Encounter (Signed)
 Please resubmit the request. Per patient, the insurance will cover if diagnosis is for diabetes (E11.8). Thanks in advance.

## 2023-08-01 NOTE — Telephone Encounter (Signed)
 [name] can you just type of a letter saying that the Mounjaro is for diabetes.  Last A1c October 24 was 6.8.  New diagnosis.  Fax it to the [place] in the separate phone note from today.

## 2023-08-14 ENCOUNTER — Encounter: Payer: Self-pay | Admitting: Family Medicine

## 2023-08-14 DIAGNOSIS — N644 Mastodynia: Secondary | ICD-10-CM

## 2023-08-30 LAB — HM MAMMOGRAPHY

## 2023-08-31 ENCOUNTER — Encounter: Payer: Self-pay | Admitting: Family Medicine

## 2023-09-13 ENCOUNTER — Other Ambulatory Visit: Payer: Self-pay | Admitting: Family Medicine

## 2023-09-20 ENCOUNTER — Encounter

## 2023-09-20 ENCOUNTER — Telehealth: Admitting: Physician Assistant

## 2023-09-20 DIAGNOSIS — B9689 Other specified bacterial agents as the cause of diseases classified elsewhere: Secondary | ICD-10-CM | POA: Diagnosis not present

## 2023-09-20 DIAGNOSIS — J019 Acute sinusitis, unspecified: Secondary | ICD-10-CM

## 2023-09-20 MED ORDER — LEVOFLOXACIN 500 MG PO TABS: 500.0000 mg | ORAL_TABLET | Freq: Every day | ORAL | 0 refills | Status: AC

## 2023-09-20 MED ORDER — BENZONATATE 100 MG PO CAPS: 100.0000 mg | ORAL_CAPSULE | Freq: Three times a day (TID) | ORAL | 0 refills | Status: DC | PRN

## 2023-09-20 NOTE — Patient Instructions (Addendum)
 Dorthula Gavel, thank you for joining Hyla Maillard, PA-C for today's virtual visit.  While this provider is not your primary care provider (PCP), if your PCP is located in our provider database this encounter information will be shared with them immediately following your visit.   A [facility] MyChart account gives you access to today's visit and all your visits, tests, and labs performed at [facility]  click here if you don't have a [facility] MyChart account or go to [email or web address]  Consent: (Patient) [name] provided verbal consent for this virtual visit at the beginning of the encounter.  Current Medications:  Current Outpatient Medications:    acetaminophen (TYLENOL) 500 MG tablet, Take 1,000 mg by mouth every 6 (six) hours as needed for moderate pain., Disp: , Rfl:    albuterol (VENTOLIN HFA) 108 (90 Base) MCG/ACT inhaler, SMARTSIG:1 Puff(s) Via Inhaler 3 Times Daily PRN, Disp: , Rfl:    ALPRAZolam  (XANAX ) 0.25 MG tablet, Take 1 tablet (0.25 mg total) by mouth daily as needed for anxiety. 1/2 - 2 tabs by mouth twice per day as needed (Patient taking differently: Take 0.125 mg by mouth daily as needed for anxiety.), Disp: 30 tablet, Rfl: 0   diclofenac Sodium (VOLTAREN) 1 % GEL, Apply 1 Application topically 2 (two) times daily as needed (pain)., Disp: , Rfl:    escitalopram  (LEXAPRO ) 10 MG tablet, TAKE 1 TABLET BY MOUTH EVERY DAY, Disp: 90 tablet, Rfl: 1   fluticasone  (FLONASE ) 50 MCG/ACT nasal spray, Place 2 sprays into both nostrils daily., Disp: 16 g, Rfl: 0   hydrochlorothiazide  (MICROZIDE ) 12.5 MG capsule, Take 1 capsule (12.5 mg total) by mouth in the morning., Disp: 90 capsule, Rfl: 3   levothyroxine  (SYNTHROID ) 150 MCG tablet, Take 1 tablet (150 mcg total) by mouth daily., Disp: 90 tablet, Rfl: 1   losartan  (COZAAR ) 25 MG tablet, Take 1 tablet (25 mg total) by mouth at bedtime., Disp: 90 tablet, Rfl: 0   meloxicam  (MOBIC ) 15 MG tablet, One tab PO  every 24 hours with a meal for 2 weeks, then once every 24 hours prn pain.  Do not start until finished with prednisone , Disp: 30 tablet, Rfl: 3   pantoprazole (PROTONIX) 40 MG tablet, Take 40 mg by mouth 2 (two) times daily., Disp: , Rfl:    sodium chloride (OCEAN) 0.65 % SOLN nasal spray, Place 1 spray into both nostrils as needed for congestion., Disp: , Rfl:    SUMAtriptan  (IMITREX ) 100 MG tablet, Take 1 tablet (100 mg total) by mouth every 2 (two) hours as needed for migraine., Disp: 10 tablet, Rfl: PRN   tirzepatide  (MOUNJARO ) 2.5 MG/0.5ML Pen, INJECT 2.5MG  SUBCUTANEOUSLY ONCE A WEEK E11.8 (INJECT INTO ABDOMEN, THIGH, OR UPPER ARM ON THE SAME DAY EACH WEEK. ROTATE SITES.), Disp: 2 mL, Rfl: 0   Medications ordered in this encounter:  No orders of the defined types were placed in this encounter.    *If you need refills on other medications prior to your next appointment, please contact your pharmacy*  Follow-Up: Call back or seek an in-person evaluation if the symptoms worsen or if the condition fails to improve as anticipated.  [facility] [phone]  Other Instructions Please take antibiotic as directed.  Increase fluid intake.  Use Saline nasal spray.  Take a daily multivitamin. Use the Tessalon as directed. Please continue your nasal steroid spray.  Place a humidifier in the bedroom.  Please call or return clinic if symptoms are not improving.  Sinusitis Sinusitis is redness, soreness, and swelling (inflammation) of the paranasal sinuses. Paranasal sinuses are air pockets within the bones of your face (beneath the eyes, the middle of the forehead, or above the eyes). In healthy paranasal sinuses, mucus is able to drain out, and air is able to circulate through them by way of your nose. However, when your paranasal sinuses are inflamed, mucus and air can become trapped. This can allow bacteria and other germs to grow and cause infection. Sinusitis can develop quickly and  last only a short time (acute) or continue over a long period (chronic). Sinusitis that lasts for more than 12 weeks is considered chronic.  CAUSES  Causes of sinusitis include: Allergies. Structural abnormalities, such as displacement of the cartilage that separates your nostrils (deviated septum), which can decrease the air flow through your nose and sinuses and affect sinus drainage. Functional abnormalities, such as when the small hairs (cilia) that line your sinuses and help remove mucus do not work properly or are not present. SYMPTOMS  Symptoms of acute and chronic sinusitis are the same. The primary symptoms are pain and pressure around the affected sinuses. Other symptoms include: Upper toothache. Earache. Headache. Bad breath. Decreased sense of smell and taste. A cough, which worsens when you are lying flat. Fatigue. Fever. Thick drainage from your nose, which often is green and may contain pus (purulent). Swelling and warmth over the affected sinuses. DIAGNOSIS  Your caregiver will perform a physical exam. During the exam, your caregiver may: Look in your nose for signs of abnormal growths in your nostrils (nasal polyps). Tap over the affected sinus to check for signs of infection. View the inside of your sinuses (endoscopy) with a special imaging device with a light attached (endoscope), which is inserted into your sinuses. If your caregiver suspects that you have chronic sinusitis, one or more of the following tests may be recommended: Allergy tests. Nasal culture A sample of mucus is taken from your nose and sent to a lab and screened for bacteria. Nasal cytology A sample of mucus is taken from your nose and examined by your caregiver to determine if your sinusitis is related to an allergy. TREATMENT  Most cases of acute sinusitis are related to a viral infection and will resolve on their own within 10 days. Sometimes medicines are prescribed to help relieve symptoms (pain  medicine, decongestants, nasal steroid sprays, or saline sprays).  However, for sinusitis related to a bacterial infection, your caregiver will prescribe antibiotic medicines. These are medicines that will help kill the bacteria causing the infection.  Rarely, sinusitis is caused by a fungal infection. In theses cases, your caregiver will prescribe antifungal medicine. For some cases of chronic sinusitis, surgery is needed. Generally, these are cases in which sinusitis recurs more than 3 times per year, despite other treatments. HOME CARE INSTRUCTIONS  Drink plenty of water. Water helps thin the mucus so your sinuses can drain more easily. Use a humidifier. Inhale steam 3 to 4 times a day (for example, sit in the bathroom with the shower running). Apply a warm, moist washcloth to your face 3 to 4 times a day, or as directed by your caregiver. Use saline nasal sprays to help moisten and clean your sinuses. Take over-the-counter or prescription medicines for pain, discomfort, or fever only as directed by your caregiver. SEEK IMMEDIATE MEDICAL CARE IF: You have increasing pain or severe headaches. You have nausea, vomiting, or drowsiness. You have swelling around your face. You have vision  problems. You have a stiff neck. You have difficulty breathing. MAKE SURE YOU:  Understand these instructions. Will watch your condition. Will get help right away if you are not doing well or get worse. Document Released: 03/28/2005 Document Revised: 06/20/2011 Document Reviewed: 04/12/2011 [facility] Patient Information 2014 [place], [place].    If you have been instructed to have an in-person evaluation today at a local Urgent Care facility, please use the link below. It will take you to a list of all of our available [facility], including address, phone number and hours of operation. Please do not delay care.  [facility]  If you or a family member do not have a primary care  provider, use the link below to schedule a visit and establish care. When you choose a [facility] primary care physician or advanced practice provider, you gain a long-term partner in health. Find a Primary Care Provider  Learn more about [facility] [facility]'s in-office and virtual care options: [facility] - Get Care Now

## 2023-09-20 NOTE — Progress Notes (Signed)
 Virtual Visit Consent   [name], you are scheduled for a virtual visit with a [facility] provider today. Just as with appointments in the office, your consent must be obtained to participate. Your consent will be active for this visit and any virtual visit you may have with one of our providers in the next 365 days. If you have a MyChart account, a copy of this consent can be sent to you electronically.  As this is a virtual visit, video technology does not allow for your provider to perform a traditional examination. This may limit your provider's ability to fully assess your condition. If your provider identifies any concerns that need to be evaluated in person or the need to arrange testing (such as labs, EKG, etc.), we will make arrangements to do so. Although advances in technology are sophisticated, we cannot ensure that it will always work on either your end or our end. If the connection with a video visit is poor, the visit may have to be switched to a telephone visit. With either a video or telephone visit, we are not always able to ensure that we have a secure connection.  By engaging in this virtual visit, you consent to the provision of healthcare and authorize for your insurance to be billed (if applicable) for the services provided during this visit. Depending on your insurance coverage, you may receive a charge related to this service.  I need to obtain your verbal consent now. Are you willing to proceed with your visit today? [name] has provided verbal consent on 09/20/2023 for a virtual visit (video or telephone). Hyla Maillard, [place]  Date: 09/20/2023 11:54 AM   Virtual Visit via Video Note   I, Hyla Maillard, connected with  [name]  ([id number], [birth year]) on 09/20/23 at 11:45 AM EDT by a video-enabled telemedicine application and verified that I am speaking with the correct person using two identifiers.  Location: Patient: Virtual Visit Location Patient:  Home Provider: Virtual Visit Location Provider: Home Office   I discussed the limitations of evaluation and management by telemedicine and the availability of in person appointments. The patient expressed understanding and agreed to proceed.    History of Present Illness: [name] is a 53 y.o. who identifies as a female who was assigned female at birth, and is being seen today for possible sinusitis. Endorses progressively worsening nasal congestion, sinus pressure, post-nasal drip and thick mucous that is yellow. Notes sinus pain and facial pain.   OTC -- Sudafed, old cough medication (promethazine), Delsym  HPI: HPI  Problems:  Patient Active Problem List   Diagnosis Date Noted   Controlled diabetes mellitus type 2 with complications (HCC) 07/31/2023   DDD (degenerative disc disease), cervical 02/24/2023   Trochanteric bursitis of both hips 02/02/2023   Cervical pain 02/02/2023   BMI 40.0-44.9, adult (HCC) 02/02/2023   Polyarthralgia 02/02/2023   Asymptomatic varicose veins of both lower extremities 10/03/2022   ADHD 06/21/2022   Hypothyroid 06/21/2022   Essential hypertension 04/26/2022   OSA (obstructive sleep apnea) 03/31/2022   Major depressive disorder, recurrent episode, moderate (HCC) 11/15/2021   Generalized anxiety disorder 11/15/2021   Left lumbar radiculopathy 06/24/2015   Sinusitis, chronic 05/24/2015   Panic 10/10/2014   Mixed headache 05/30/2014   Hair loss    Depression with anxiety    Fibromyalgia     Allergies:  Allergies  Allergen Reactions   Azithromycin Diarrhea    Abdominal pain - stomach ulcers   Cefdinir  Diarrhea    Abdominal pain - has stomach ulcers   Other Dermatitis    Surgical glue    Doxycycline  Itching and Rash   Erythromycin Itching and Rash   Keflex [Cephalexin] Rash   Latex Rash   Penicillins Rash   Sulfa Antibiotics Rash   Sulfasalazine Rash   Medications:  Current Outpatient Medications:    benzonatate (TESSALON) 100 MG  capsule, Take 1 capsule (100 mg total) by mouth 3 (three) times daily as needed for cough., Disp: 30 capsule, Rfl: 0   levofloxacin  (LEVAQUIN ) 500 MG tablet, Take 1 tablet (500 mg total) by mouth daily for 7 days., Disp: 7 tablet, Rfl: 0   acetaminophen (TYLENOL) 500 MG tablet, Take 1,000 mg by mouth every 6 (six) hours as needed for moderate pain., Disp: , Rfl:    albuterol (VENTOLIN HFA) 108 (90 Base) MCG/ACT inhaler, SMARTSIG:1 Puff(s) Via Inhaler 3 Times Daily PRN, Disp: , Rfl:    ALPRAZolam  (XANAX ) 0.25 MG tablet, Take 1 tablet (0.25 mg total) by mouth daily as needed for anxiety. 1/2 - 2 tabs by mouth twice per day as needed (Patient taking differently: Take 0.125 mg by mouth daily as needed for anxiety.), Disp: 30 tablet, Rfl: 0   diclofenac Sodium (VOLTAREN) 1 % GEL, Apply 1 Application topically 2 (two) times daily as needed (pain)., Disp: , Rfl:    escitalopram  (LEXAPRO ) 10 MG tablet, TAKE 1 TABLET BY MOUTH EVERY DAY, Disp: 90 tablet, Rfl: 1   fluticasone  (FLONASE ) 50 MCG/ACT nasal spray, Place 2 sprays into both nostrils daily., Disp: 16 g, Rfl: 0   hydrochlorothiazide  (MICROZIDE ) 12.5 MG capsule, Take 1 capsule (12.5 mg total) by mouth in the morning., Disp: 90 capsule, Rfl: 3   levothyroxine  (SYNTHROID ) 150 MCG tablet, Take 1 tablet (150 mcg total) by mouth daily., Disp: 90 tablet, Rfl: 1   losartan  (COZAAR ) 25 MG tablet, Take 1 tablet (25 mg total) by mouth at bedtime., Disp: 90 tablet, Rfl: 0   meloxicam  (MOBIC ) 15 MG tablet, One tab PO every 24 hours with a meal for 2 weeks, then once every 24 hours prn pain.  Do not start until finished with prednisone , Disp: 30 tablet, Rfl: 3   pantoprazole (PROTONIX) 40 MG tablet, Take 40 mg by mouth 2 (two) times daily., Disp: , Rfl:    sodium chloride (OCEAN) 0.65 % SOLN nasal spray, Place 1 spray into both nostrils as needed for congestion., Disp: , Rfl:    SUMAtriptan  (IMITREX ) 100 MG tablet, Take 1 tablet (100 mg total) by mouth every 2 (two)  hours as needed for migraine., Disp: 10 tablet, Rfl: PRN   tirzepatide  (MOUNJARO ) 2.5 MG/0.5ML Pen, INJECT 2.5MG  SUBCUTANEOUSLY ONCE A WEEK E11.8 (INJECT INTO ABDOMEN, THIGH, OR UPPER ARM ON THE SAME DAY EACH WEEK. ROTATE SITES.), Disp: 2 mL, Rfl: 0  Observations/Objective: Patient is well-developed, well-nourished in no acute distress.  Resting comfortably at home.  Head is normocephalic, atraumatic.  No labored breathing. Speech is clear and coherent with logical content.  Patient is alert and oriented at baseline.   Assessment and Plan: 1. Acute bacterial sinusitis (Primary) - levofloxacin  (LEVAQUIN ) 500 MG tablet; Take 1 tablet (500 mg total) by mouth daily for 7 days.  Dispense: 7 tablet; Refill: 0 - benzonatate (TESSALON) 100 MG capsule; Take 1 capsule (100 mg total) by mouth 3 (three) times daily as needed for cough.  Dispense: 30 capsule; Refill: 0  Rx Levaquin .  Increase fluids.  Rest.  Saline nasal spray.  Probiotic.  Mucinex as directed.  Humidifier in bedroom. Tessalon per orders.  Call or return to clinic if symptoms are not improving.   Follow Up Instructions: I discussed the assessment and treatment plan with the patient. The patient was provided an opportunity to ask questions and all were answered. The patient agreed with the plan and demonstrated an understanding of the instructions.  A copy of instructions were sent to the patient via MyChart unless otherwise noted below.   The patient was advised to call back or seek an in-person evaluation if the symptoms worsen or if the condition fails to improve as anticipated.    Hyla Maillard, PA-C

## 2023-10-28 ENCOUNTER — Other Ambulatory Visit: Payer: Self-pay | Admitting: Family Medicine

## 2023-10-28 DIAGNOSIS — F331 Major depressive disorder, recurrent, moderate: Secondary | ICD-10-CM

## 2023-10-28 DIAGNOSIS — F411 Generalized anxiety disorder: Secondary | ICD-10-CM

## 2023-10-29 ENCOUNTER — Other Ambulatory Visit: Payer: Self-pay | Admitting: Family Medicine

## 2023-10-29 DIAGNOSIS — I10 Essential (primary) hypertension: Secondary | ICD-10-CM

## 2023-11-01 ENCOUNTER — Encounter: Payer: Self-pay | Admitting: Family Medicine

## 2023-11-01 DIAGNOSIS — I10 Essential (primary) hypertension: Secondary | ICD-10-CM

## 2023-11-01 MED ORDER — LOSARTAN POTASSIUM 25 MG PO TABS
25.0000 mg | ORAL_TABLET | Freq: Every evening | ORAL | 0 refills | Status: DC
Start: 2023-11-01 — End: 2023-12-01

## 2023-12-01 MED ORDER — LOSARTAN POTASSIUM 25 MG PO TABS: 25.0000 mg | ORAL_TABLET | Freq: Every evening | ORAL | 0 refills | Status: DC

## 2023-12-01 NOTE — Telephone Encounter (Signed)
 I called and scheduled patient. Sent in a 30 day supply.

## 2023-12-07 ENCOUNTER — Encounter: Payer: Self-pay | Admitting: Family Medicine

## 2023-12-07 ENCOUNTER — Ambulatory Visit (INDEPENDENT_AMBULATORY_CARE_PROVIDER_SITE_OTHER): Admitting: Family Medicine

## 2023-12-07 ENCOUNTER — Other Ambulatory Visit: Payer: Self-pay | Admitting: Family Medicine

## 2023-12-07 VITALS — BP 118/70 | HR 80 | Ht 65.0 in | Wt 224.0 lb

## 2023-12-07 DIAGNOSIS — E118 Type 2 diabetes mellitus with unspecified complications: Secondary | ICD-10-CM

## 2023-12-07 DIAGNOSIS — E559 Vitamin D deficiency, unspecified: Secondary | ICD-10-CM

## 2023-12-07 DIAGNOSIS — R748 Abnormal levels of other serum enzymes: Secondary | ICD-10-CM

## 2023-12-07 DIAGNOSIS — I10 Essential (primary) hypertension: Secondary | ICD-10-CM | POA: Diagnosis not present

## 2023-12-07 DIAGNOSIS — E890 Postprocedural hypothyroidism: Secondary | ICD-10-CM | POA: Diagnosis not present

## 2023-12-07 MED ORDER — ALBUTEROL SULFATE HFA 108 (90 BASE) MCG/ACT IN AERS
1.0000 | INHALATION_SPRAY | RESPIRATORY_TRACT | 1 refills | Status: AC | PRN
  Filled 2023-12-07: qty 6.7, 17d supply, fill #0

## 2023-12-07 NOTE — Progress Notes (Signed)
 Established Patient Office Visit  Subjective  Patient ID: [name], female    DOB: [birth year]  Age: 53 y.o. MRN: [id number]  Chief Complaint  Patient presents with   Hypertension    HPI  Discussed the use of AI scribe software for clinical note transcription with the patient, who gave verbal consent to proceed.  History of Present Illness [name] is a 53 year old female who presents with gastrointestinal discomfort and respiratory symptoms.  Gastrointestinal symptoms - Constipation managed with Senokot and magnesium; Senokot causes cramping if more than one pill is taken - Severe stomach pain upon waking - Gastroesophageal reflux symptoms, worsened by eating late or consuming large meals - Morning vomiting if eating late at night - Dietary modifications include avoiding bread due to intolerance and moving dinner time earlier to prevent late eating - Drinks 40 to 80 ounces of water daily - Weight loss of 25 pounds since May  Respiratory symptoms - Persistent cough and raspy voice for the past week, onset Monday - Sensation of a 'catch' with deep inspiration - Nasal Congestion present - Minimal relief with use of albuterol  inhaler - Partial relief with Mucinex, Sudafed, and allergy medication  Sleep disturbance and fatigue - Did not use sleep CPAP  the previous night - Feeling tired today - No body aches outside of baseline neck pain  Psychological stress - 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  HENT:     Head: Normocephalic and atraumatic.     Right Ear: Tympanic membrane, ear canal and external ear normal. There is no  impacted cerumen.     Left Ear: Tympanic membrane, ear canal and external ear normal. There is no impacted cerumen.     Nose: Nose normal.     Mouth/Throat:     Pharynx: Oropharynx is clear.  Eyes:     Conjunctiva/sclera: Conjunctivae normal.  Cardiovascular:     Rate and Rhythm: Normal rate and regular rhythm.  Pulmonary:     Effort: Pulmonary effort is normal.     Breath sounds: Normal breath sounds.  Musculoskeletal:     Cervical back: Neck supple. No tenderness.  Lymphadenopathy:     Cervical: No cervical adenopathy.  Skin:    General: Skin is warm and dry.  Neurological:     Mental Status: She is alert and oriented to person, place, and time.  Psychiatric:        Mood and Affect: Mood normal.      No results found for any visits on 12/07/23.    The 10-year ASCVD risk score (Arnett DK, et al., 2019) is: 5.8%    Assessment & Plan:   Problem List Items Addressed This Visit       Cardiovascular and Mediastinum   Essential hypertension - Primary   Borderline elevated today. Ideally under 120.          Endocrine   Hypothyroid   Follows with Dr. Hosie. ON Mounjaro  5mg  and having constipation and Gastroparesis. Work on treating constipation with Miralax. Eating smaller amounts. Avoiding  bread since that seems to be a trigger. Has 25 lbs.       Assessment and Plan Assessment & Plan Constipation due to Mounjaro  (tirzepatide ) Chronic constipation likely secondary to Mounjaro  use, causing delayed gastric emptying and discomfort. Current regimen insufficient. She is willing to continue Mounjaro  due to significant weight loss. - Switch from Senokot to Miralax, using the cap as a measuring cup, mixed with six ounces of water, to be taken nightly after dinner. - Advise to increase Miralax to twice daily over the weekend if constipation persists, then reduce to once daily. - Continue magnesium supplementation in the morning if desired. - Monitor stool consistency and  adjust Miralax or magnesium as needed to prevent loose stools. - Encourage continued hydration.  Gastroesophageal reflux with vomiting Recurrent gastroesophageal reflux with vomiting, exacerbated by certain foods, particularly bread. Symptoms include postprandial fullness and delayed gastric emptying. Likely related to Mounjaro  use. Dietary modifications recommended. - Advise to continue avoiding bread and other trigger foods. - Recommend eating smaller meals and pausing for 15-20 minutes to assess fullness before continuing to eat. - Encourage maintaining earlier dinner times to prevent nocturnal symptoms.  Follow-Up Follow-up for ongoing management of constipation and gastroesophageal reflux symptoms. - Perform blood work today, including vitamin D  and A1c levels.  Labs ordered I sperate enouncter today since chwat was locked while she was here.    A1C, CMP, Vit D.   No follow-ups on file.    Dorothyann Byars, MD

## 2023-12-07 NOTE — Progress Notes (Signed)
 Orders Placed This Encounter  Procedures   VITAMIN D  25 Hydroxy (Vit-D Deficiency, Fractures)   CMP14+EGFR   Hemoglobin A1c

## 2023-12-07 NOTE — Assessment & Plan Note (Addendum)
 Follows with Dr. Hosie. ON Mounjaro  5mg  and having constipation and Gastroparesis. Work on treating constipation with Miralax. Eating smaller amounts. Avoiding bread since that seems to be a trigger. Has 25 lbs.

## 2023-12-07 NOTE — Assessment & Plan Note (Signed)
 Borderline elevated today. Ideally under 120.

## 2023-12-08 ENCOUNTER — Other Ambulatory Visit (HOSPITAL_BASED_OUTPATIENT_CLINIC_OR_DEPARTMENT_OTHER): Payer: Self-pay

## 2023-12-08 LAB — CMP14+EGFR

## 2023-12-10 LAB — CMP14+EGFR

## 2023-12-12 ENCOUNTER — Encounter: Payer: Self-pay | Admitting: Sports Medicine

## 2023-12-12 ENCOUNTER — Ambulatory Visit: Payer: Self-pay | Admitting: Family Medicine

## 2023-12-12 NOTE — Progress Notes (Signed)
 Please call lab and se why CMP was canceled.

## 2023-12-13 LAB — HEMOGLOBIN A1C
Est. average glucose Bld gHb Est-mCnc: 114 mg/dL
Hgb A1c MFr Bld: 5.6 % (ref 4.8–5.6)

## 2023-12-13 LAB — VITAMIN D 25 HYDROXY (VIT D DEFICIENCY, FRACTURES)

## 2023-12-13 LAB — CMP14+EGFR

## 2023-12-18 ENCOUNTER — Other Ambulatory Visit (HOSPITAL_BASED_OUTPATIENT_CLINIC_OR_DEPARTMENT_OTHER): Payer: Self-pay

## 2024-01-02 ENCOUNTER — Encounter: Payer: Self-pay | Admitting: Family Medicine

## 2024-01-02 ENCOUNTER — Other Ambulatory Visit: Payer: Self-pay | Admitting: Family Medicine

## 2024-01-02 DIAGNOSIS — I10 Essential (primary) hypertension: Secondary | ICD-10-CM

## 2024-01-02 DIAGNOSIS — E559 Vitamin D deficiency, unspecified: Secondary | ICD-10-CM

## 2024-01-02 DIAGNOSIS — E118 Type 2 diabetes mellitus with unspecified complications: Secondary | ICD-10-CM

## 2024-01-02 DIAGNOSIS — R748 Abnormal levels of other serum enzymes: Secondary | ICD-10-CM

## 2024-01-02 NOTE — Telephone Encounter (Signed)
 CMP and Vit D test show Cancelled from August due to insufficient specimen.  I will reorder these , but did you wan to add any other blood work orders to be drawn based on patient request?

## 2024-01-02 NOTE — Telephone Encounter (Signed)
 Lab ordered.  Maybe she di talking about he abdominal US  for the liver?

## 2024-01-03 NOTE — Telephone Encounter (Signed)
 Last read by Emile Ermalinda Mar at 7:21AM on 01/03/2024.

## 2024-02-25 ENCOUNTER — Telehealth: Admitting: Family

## 2024-02-25 DIAGNOSIS — B029 Zoster without complications: Secondary | ICD-10-CM | POA: Diagnosis not present

## 2024-02-25 MED ORDER — VALACYCLOVIR HCL 1 G PO TABS: 1000.0000 mg | ORAL_TABLET | Freq: Three times a day (TID) | ORAL | 0 refills | Status: AC

## 2024-02-25 NOTE — Progress Notes (Signed)

## 2024-03-25 ENCOUNTER — Other Ambulatory Visit: Payer: Self-pay | Admitting: *Deleted

## 2024-03-25 DIAGNOSIS — M5416 Radiculopathy, lumbar region: Secondary | ICD-10-CM

## 2024-03-25 MED ORDER — MELOXICAM 15 MG PO TABS: 15.0000 mg | ORAL_TABLET | Freq: Every day | ORAL | 3 refills | Status: AC

## 2024-03-28 ENCOUNTER — Ambulatory Visit: Admitting: Family Medicine

## 2024-04-18 ENCOUNTER — Telehealth: Admitting: Emergency Medicine

## 2024-04-18 DIAGNOSIS — J019 Acute sinusitis, unspecified: Secondary | ICD-10-CM | POA: Diagnosis not present

## 2024-04-18 DIAGNOSIS — B9689 Other specified bacterial agents as the cause of diseases classified elsewhere: Secondary | ICD-10-CM | POA: Diagnosis not present

## 2024-04-18 MED ORDER — LEVOFLOXACIN 500 MG PO TABS: 500.0000 mg | ORAL_TABLET | Freq: Every day | ORAL | 0 refills | Status: AC

## 2024-04-18 NOTE — Progress Notes (Signed)
 E-Visit for Sinus Problems  We are sorry that you are not feeling well.  Here is how we plan to help!  Based on what you have shared with me it looks like you have sinusitis.  Sinusitis is inflammation and infection in the sinus cavities of the head.  Based on your presentation I believe you most likely have Acute Bacterial Sinusitis.  This is an infection caused by bacteria and is treated with antibiotics. I have prescribed Levofloxicin 500mg  by mouth once daily for 7 days. You may use an oral decongestant such as Mucinex D or if you have glaucoma or high blood pressure use plain Mucinex. Saline nasal spray help and can safely be used as often as needed for congestion.  If you develop worsening sinus pain, fever or notice severe headache and vision changes, or if symptoms are not better after completion of antibiotic, please schedule an appointment with a health care provider.    Sinus infections are not as easily transmitted as other respiratory infection, however we still recommend that you avoid close contact with loved ones, especially the very young and elderly.  Remember to wash your hands thoroughly throughout the day as this is the number one way to prevent the spread of infection!  Home Care: Only take medications as instructed by your medical team. Complete the entire course of an antibiotic. Do not take these medications with alcohol. A steam or ultrasonic humidifier can help congestion.  You can place a towel over your head and breathe in the steam from hot water coming from a faucet. Avoid close contacts especially the very young and the elderly. Cover your mouth when you cough or sneeze. Always remember to wash your hands.  Get Help Right Away If: You develop worsening fever or sinus pain. You develop a severe head ache or visual changes. Your symptoms persist after you have completed your treatment plan.  Make sure you Understand these instructions. Will watch your  condition. Will get help right away if you are not doing well or get worse.  Your e-visit answers were reviewed by a board certified advanced clinical practitioner to complete your personal care plan.  Depending on the condition, your plan could have included both over the counter or prescription medications.  If there is a problem please reply  once you have received a response from your provider.  Your safety is important to us .  If you have drug allergies check your prescription carefully.    You can use MyChart to ask questions about today's visit, request a non-urgent call back, or ask for a work or school excuse for 24 hours related to this e-Visit. If it has been greater than 24 hours you will need to follow up with your provider, or enter a new e-Visit to address those concerns.  You will get an e-mail in the next two days asking about your experience.  I hope that your e-visit has been valuable and will speed your recovery. Thank you for using e-visits.  I have spent 5 minutes in review of e-visit questionnaire, review and updating patient chart, medical decision making and response to patient.   Elsie Velma Lunger, PA-C
